# Patient Record
Sex: Male | Born: 1960 | Race: White | Hispanic: No | Smoking: Never smoker
Health system: Southern US, Community
[De-identification: ages and names within clinical notes are randomized; demographics above are authoritative.]

## PROBLEM LIST (undated history)

## (undated) DIAGNOSIS — M1990 Unspecified osteoarthritis, unspecified site: Secondary | ICD-10-CM

## (undated) HISTORY — PX: WISDOM TOOTH EXTRACTION: SHX21

## (undated) HISTORY — PX: JOINT REPLACEMENT: SHX530

---

## 2015-05-27 NOTE — H&P (Signed)
TOTAL HIP ADMISSION H&P  Patient is admitted for right total hip arthroplasty, anterior approach.  Subjective:  Chief Complaint:    Right hip primary OA / pain  HPI: Scott Stevenson, 54 y.o. male, has a history of pain and functional disability in the right hip(s) due to arthritis and patient has failed non-surgical conservative treatments for greater than 12 weeks to include NSAID's and/or analgesics, corticosteriod injections and activity modification.  Onset of symptoms was gradual starting 2+ years ago with gradually worsening course since that time.The patient noted no past surgery on the right hip(s).  Patient currently rates pain in the right hip at 5 out of 10 with activity. Patient has worsening of pain with activity and weight bearing, trendelenberg gait, pain that interfers with activities of daily living and pain with passive range of motion. Patient has evidence of periarticular osteophytes and joint space narrowing by imaging studies. This condition presents safety issues increasing the risk of falls.   There is no current active infection.  Risks, benefits and expectations were discussed with the patient.  Risks including but not limited to the risk of anesthesia, blood clots, nerve damage, blood vessel damage, failure of the prosthesis, infection and up to and including death.  Patient understand the risks, benefits and expectations and wishes to proceed with surgery.   PCP: Eartha Inch, MD  D/C Plans:      Home with HHPT  Post-op Meds:       No Rx given   Tranexamic Acid:      To be given - IV  Decadron:      Is to be given  FYI:     ASA post-op  Norco post-op    There are no active problems to display for this patient.  Past Medical History  Diagnosis Date  . Arthritis     Past Surgical History  Procedure Laterality Date  . Wisdom tooth extraction      No prescriptions prior to admission   No Known Allergies   History  Substance Use Topics  . Smoking  status: Never Smoker   . Smokeless tobacco: Not on file  . Alcohol Use: Yes     Comment: occasional       Review of Systems  Constitutional: Negative.   Eyes: Negative.   Respiratory: Negative.   Cardiovascular: Negative.   Gastrointestinal: Negative.   Genitourinary: Negative.   Musculoskeletal: Positive for joint pain.  Skin: Negative.   Neurological: Positive for headaches.  Endo/Heme/Allergies: Negative.   Psychiatric/Behavioral: Negative.     Objective:  Physical Exam  Constitutional: He is oriented to person, place, and time. He appears well-developed.  HENT:  Head: Normocephalic.  Eyes: Pupils are equal, round, and reactive to light.  Neck: Neck supple. No JVD present. No tracheal deviation present. No thyromegaly present.  Cardiovascular: Normal rate, regular rhythm, normal heart sounds and intact distal pulses.   Respiratory: Effort normal and breath sounds normal. No stridor. No respiratory distress. He has no wheezes.  GI: Soft. There is no tenderness. There is no guarding.  Musculoskeletal:       Right hip: He exhibits decreased range of motion, decreased strength, tenderness and bony tenderness. He exhibits no swelling, no deformity and no laceration.  Lymphadenopathy:    He has no cervical adenopathy.  Neurological: He is alert and oriented to person, place, and time.  Skin: Skin is warm and dry.  Psychiatric: He has a normal mood and affect.     Imaging Review  Plain radiographs demonstrate severe degenerative joint disease of the right hip(s). The bone quality appears to be good for age and reported activity level.  Assessment/Plan:  End stage arthritis, right hip(s)  The patient history, physical examination, clinical judgement of the provider and imaging studies are consistent with end stage degenerative joint disease of the right hip(s) and total hip arthroplasty is deemed medically necessary. The treatment options including medical management,  injection therapy, arthroscopy and arthroplasty were discussed at length. The risks and benefits of total hip arthroplasty were presented and reviewed. The risks due to aseptic loosening, infection, stiffness, dislocation/subluxation,  thromboembolic complications and other imponderables were discussed.  The patient acknowledged the explanation, agreed to proceed with the plan and consent was signed. Patient is being admitted for inpatient treatment for surgery, pain control, PT, OT, prophylactic antibiotics, VTE prophylaxis, progressive ambulation and ADL's and discharge planning.The patient is planning to be discharged home with home health services.    Anastasio Auerbach Gwendolynn Merkey   PA-C  06/03/2015, 2:14 PM

## 2015-05-31 NOTE — Patient Instructions (Addendum)
YOUR PROCEDURE IS SCHEDULED ON :  06/04/15  REPORT TO [facility] MAIN ENTRANCE FOLLOW SIGNS TO EAST ELEVATOR - GO TO 3rd FLOOR CHECK IN AT 3 EAST NURSES STATION (SHORT STAY) AT:  5:15 AM  CALL THIS NUMBER IF YOU HAVE PROBLEMS THE MORNING OF SURGERY [phone]  REMEMBER:ONLY 1 PER PERSON MAY GO TO SHORT STAY WITH YOU TO GET READY THE MORNING OF YOUR SURGERY  DO NOT EAT FOOD OR DRINK LIQUIDS AFTER MIDNIGHT  TAKE THESE MEDICINES THE MORNING OF SURGERY: NONE  YOU MAY NOT HAVE ANY METAL ON YOUR BODY INCLUDING HAIR PINS AND PIERCING'S. DO NOT WEAR JEWELRY, MAKEUP, LOTIONS, POWDERS OR PERFUMES. DO NOT WEAR NAIL POLISH. DO NOT SHAVE 48 HRS PRIOR TO SURGERY. MEN MAY SHAVE FACE AND NECK.  DO NOT BRING VALUABLES TO HOSPITAL. [facility] IS NOT RESPONSIBLE FOR VALUABLES.  CONTACTS, DENTURES OR PARTIALS MAY NOT BE WORN TO SURGERY. LEAVE SUITCASE IN CAR. CAN BE BROUGHT TO ROOM AFTER SURGERY.  PATIENTS DISCHARGED THE DAY OF SURGERY WILL NOT BE ALLOWED TO DRIVE HOME.  PLEASE READ OVER THE FOLLOWING INSTRUCTION SHEETS _________________________________________________________________________________                                           - PREPARING FOR SURGERY  Before surgery, you can play an important role.  Because skin is not sterile, your skin needs to be as free of germs as possible.  You can reduce the number of germs on your skin by washing with CHG (chlorahexidine gluconate) soap before surgery.  CHG is an antiseptic cleaner which kills germs and bonds with the skin to continue killing germs even after washing. Please DO NOT use if you have an allergy to CHG or antibacterial soaps.  If your skin becomes reddened/irritated stop using the CHG and inform your nurse when you arrive at Short Stay. Do not shave (including legs and underarms) for at least 48 hours prior to the first CHG shower.  You may shave your face. Please follow these instructions  carefully:   1.  Shower with CHG Soap the night before surgery and the  morning of Surgery.   2.  If you choose to wash your hair, wash your hair first as usual with your  normal  Shampoo.   3.  After you shampoo, rinse your hair and body thoroughly to remove the  shampoo.                                         4.  Use CHG as you would any other liquid soap.  You can apply chg directly  to the skin and wash . Gently wash with scrungie or clean wascloth    5.  Apply the CHG Soap to your body ONLY FROM THE NECK DOWN.   Do not use on open                           Wound or open sores. Avoid contact with eyes, ears mouth and genitals (private parts).                        Genitals (private parts) with your normal soap.  6.  Wash thoroughly, paying special attention to the area where your surgery  will be performed.   7.  Thoroughly rinse your body with warm water from the neck down.   8.  DO NOT shower/wash with your normal soap after using and rinsing off  the CHG Soap .                9.  Pat yourself dry with a clean towel.             10.  Wear clean night clothes to bed after shower             11.  Place clean sheets on your bed the night of your first shower and do not  sleep with pets.  Day of Surgery : Do not apply any lotions/deodorants the morning of surgery.  Please wear clean clothes to the hospital/surgery center.  FAILURE TO FOLLOW THESE INSTRUCTIONS MAY RESULT IN THE CANCELLATION OF YOUR SURGERY    PATIENT SIGNATURE_________________________________  ______________________________________________________________________     Scott Stevenson  An incentive spirometer is a tool that can help keep your lungs clear and active. This tool measures how well you are filling your lungs with each breath. Taking long deep breaths may help reverse or decrease the chance of developing breathing (pulmonary) problems (especially infection) following:  A long  period of time when you are unable to move or be active. BEFORE THE PROCEDURE   If the spirometer includes an indicator to show your best effort, your nurse or respiratory therapist will set it to a desired goal.  If possible, sit up straight or lean slightly forward. Try not to slouch.  Hold the incentive spirometer in an upright position. INSTRUCTIONS FOR USE   Sit on the edge of your bed if possible, or sit up as far as you can in bed or on a chair.  Hold the incentive spirometer in an upright position.  Breathe out normally.  Place the mouthpiece in your mouth and seal your lips tightly around it.  Breathe in slowly and as deeply as possible, raising the piston or the ball toward the top of the column.  Hold your breath for 3-5 seconds or for as long as possible. Allow the piston or ball to fall to the bottom of the column.  Remove the mouthpiece from your mouth and breathe out normally.  Rest for a few seconds and repeat Steps 1 through 7 at least 10 times every 1-2 hours when you are awake. Take your time and take a few normal breaths between deep breaths.  The spirometer may include an indicator to show your best effort. Use the indicator as a goal to work toward during each repetition.  After each set of 10 deep breaths, practice coughing to be sure your lungs are clear. If you have an incision (the cut made at the time of surgery), support your incision when coughing by placing a pillow or rolled up towels firmly against it. Once you are able to get out of bed, walk around indoors and cough well. You may stop using the incentive spirometer when instructed by your caregiver.  RISKS AND COMPLICATIONS  Take your time so you do not get dizzy or light-headed.  If you are in pain, you may need to take or ask for pain medication before doing incentive spirometry. It is harder to take a deep breath if you are having pain. AFTER USE  Rest and breathe slowly and easily.  It can  be helpful to keep track of a log of your progress. Your caregiver can provide you with a simple table to help with this. If you are using the spirometer at home, follow these instructions: [place] IF:   You are having difficultly using the spirometer.  You have trouble using the spirometer as often as instructed.  Your pain medication is not giving enough relief while using the spirometer.  You develop fever of 100.5 F (38.1 C) or higher. SEEK IMMEDIATE MEDICAL CARE IF:   You cough up bloody sputum that had not been present before.  You develop fever of 102 F (38.9 C) or greater.  You develop worsening pain at or near the incision site. MAKE SURE YOU:   Understand these instructions.  Will watch your condition.  Will get help right away if you are not doing well or get worse. Document Released: 02/22/2007 Document Revised: 01/04/2012 Document Reviewed: 04/25/2007 [employer] Patient Information 2014 [employer], [place].   ________________________________________________________________________  WHAT IS A BLOOD TRANSFUSION? Blood Transfusion Information  A transfusion is the replacement of blood or some of its parts. Blood is made up of multiple cells which provide different functions.  Red blood cells carry oxygen and are used for blood loss replacement.  White blood cells fight against infection.  Platelets control bleeding.  Plasma helps clot blood.  Other blood products are available for specialized needs, such as hemophilia or other clotting disorders. BEFORE THE TRANSFUSION  Who gives blood for transfusions?   Healthy volunteers who are fully evaluated to make sure their blood is safe. This is blood bank blood. Transfusion therapy is the safest it has ever been in the practice of medicine. Before blood is taken from a donor, a complete history is taken to make sure that person has no history of diseases nor engages in risky social behavior (examples are  intravenous drug use or sexual activity with multiple partners). The donor's travel history is screened to minimize risk of transmitting infections, such as malaria. The donated blood is tested for signs of infectious diseases, such as HIV and hepatitis. The blood is then tested to be sure it is compatible with you in order to minimize the chance of a transfusion reaction. If you or a relative donates blood, this is often done in anticipation of surgery and is not appropriate for emergency situations. It takes many days to process the donated blood. RISKS AND COMPLICATIONS Although transfusion therapy is very safe and saves many lives, the main dangers of transfusion include:   Getting an infectious disease.  Developing a transfusion reaction. This is an allergic reaction to something in the blood you were given. Every precaution is taken to prevent this. The decision to have a blood transfusion has been considered carefully by your caregiver before blood is given. Blood is not given unless the benefits outweigh the risks. AFTER THE TRANSFUSION  Right after receiving a blood transfusion, you will usually feel much better and more energetic. This is especially true if your red blood cells have gotten low (anemic). The transfusion raises the level of the red blood cells which carry oxygen, and this usually causes an energy increase.  The nurse administering the transfusion will monitor you carefully for complications. HOME CARE INSTRUCTIONS  No special instructions are needed after a transfusion. You may find your energy is better. Speak with your caregiver about any limitations on activity for underlying diseases you may have. SEEK MEDICAL CARE IF:   Your  condition is not improving after your transfusion.  You develop redness or irritation at the intravenous (IV) site. SEEK IMMEDIATE MEDICAL CARE IF:  Any of the following symptoms occur over the next 12 hours:  Shaking chills.  You have a  temperature by mouth above 102 F (38.9 C), not controlled by medicine.  Chest, back, or muscle pain.  People around you feel you are not acting correctly or are confused.  Shortness of breath or difficulty breathing.  Dizziness and fainting.  You get a rash or develop hives.  You have a decrease in urine output.  Your urine turns a dark color or changes to pink, red, or brown. Any of the following symptoms occur over the next 10 days:  You have a temperature by mouth above 102 F (38.9 C), not controlled by medicine.  Shortness of breath.  Weakness after normal activity.  The white part of the eye turns yellow (jaundice).  You have a decrease in the amount of urine or are urinating less often.  Your urine turns a dark color or changes to pink, red, or brown. Document Released: 10/09/2000 Document Revised: 01/04/2012 Document Reviewed: 05/28/2008 [facility] Patient Information 2014 [place], [place].  _______________________________________________________________________

## 2015-06-03 ENCOUNTER — Encounter (HOSPITAL_COMMUNITY)
Admission: RE | Admit: 2015-06-03 | Discharge: 2015-06-03 | Disposition: A | Discharge status: Home / Self Care | Payer: BC Managed Care – PPO | Source: Ambulatory Visit | Attending: Orthopedic Surgery | Admitting: Orthopedic Surgery

## 2015-06-03 ENCOUNTER — Encounter (HOSPITAL_COMMUNITY): Payer: Self-pay

## 2015-06-03 ENCOUNTER — Encounter (HOSPITAL_COMMUNITY): Payer: Self-pay | Admitting: Anesthesiology

## 2015-06-03 HISTORY — DX: Unspecified osteoarthritis, unspecified site: M19.90

## 2015-06-03 LAB — BASIC METABOLIC PANEL
ANION GAP: 6 (ref 5–15)
BUN: 16 mg/dL (ref 6–20)
CHLORIDE: 102 mmol/L (ref 101–111)
CO2: 27 mmol/L (ref 22–32)
Calcium: 9.1 mg/dL (ref 8.9–10.3)
Creatinine, Ser: 0.99 mg/dL (ref 0.61–1.24)
GLUCOSE: 105 mg/dL — AB (ref 65–99)
Potassium: 4.4 mmol/L (ref 3.5–5.1)
Sodium: 135 mmol/L (ref 135–145)

## 2015-06-03 LAB — URINALYSIS, ROUTINE W REFLEX MICROSCOPIC
Bilirubin Urine: NEGATIVE
Glucose, UA: NEGATIVE mg/dL
Hgb urine dipstick: NEGATIVE
KETONES UR: NEGATIVE mg/dL
Leukocytes, UA: NEGATIVE
NITRITE: NEGATIVE
PH: 5.5 (ref 5.0–8.0)
PROTEIN: NEGATIVE mg/dL
Specific Gravity, Urine: 1.013 (ref 1.005–1.030)
Urobilinogen, UA: 0.2 mg/dL (ref 0.0–1.0)

## 2015-06-03 LAB — CBC
HEMATOCRIT: 43.8 % (ref 39.0–52.0)
HEMOGLOBIN: 14.3 g/dL (ref 13.0–17.0)
MCH: 32.4 pg (ref 26.0–34.0)
MCHC: 32.6 g/dL (ref 30.0–36.0)
MCV: 99.3 fL (ref 78.0–100.0)
PLATELETS: 239 10*3/uL (ref 150–400)
RBC: 4.41 MIL/uL (ref 4.22–5.81)
RDW: 12.7 % (ref 11.5–15.5)
WBC: 5.4 10*3/uL (ref 4.0–10.5)

## 2015-06-03 LAB — ABO/RH: ABO/RH(D): A POS

## 2015-06-03 LAB — PROTIME-INR
INR: 0.92 (ref 0.00–1.49)
PROTHROMBIN TIME: 12.6 s (ref 11.6–15.2)

## 2015-06-03 LAB — APTT: aPTT: 28 seconds (ref 24–37)

## 2015-06-03 NOTE — Progress Notes (Signed)
PT is currently using nasal Mupuricin ointment per Dr. Nilsa Nutting order - no PCR done at PST appt.

## 2015-06-03 NOTE — Anesthesia Preprocedure Evaluation (Addendum)
Anesthesia Evaluation  Patient identified by MRN, date of birth, ID band Patient awake    Reviewed: Allergy & Precautions, NPO status , Patient's Chart, lab work & pertinent test results  Airway Mallampati: II  TM Distance: >3 FB Neck ROM: Full    Dental no notable dental hx.    Pulmonary neg pulmonary ROS,  breath sounds clear to auscultation  Pulmonary exam normal       Cardiovascular negative cardio ROS Normal cardiovascular examRhythm:Regular Rate:Normal     Neuro/Psych negative neurological ROS  negative psych ROS   GI/Hepatic negative GI ROS, Neg liver ROS,   Endo/Other  negative endocrine ROS  Renal/GU negative Renal ROS  negative genitourinary   Musculoskeletal  (+) Arthritis -,   Abdominal   Peds negative pediatric ROS (+)  Hematology negative hematology ROS (+)   Anesthesia Other Findings   Reproductive/Obstetrics negative OB ROS                            Anesthesia Physical Anesthesia Plan  ASA: II  Anesthesia Plan: Spinal   Post-op Pain Management:    Induction: Intravenous  Airway Management Planned: Natural Airway  Additional Equipment:   Intra-op Plan:   Post-operative Plan:   Informed Consent: I have reviewed the patients History and Physical, chart, labs and discussed the procedure including the risks, benefits and alternatives for the proposed anesthesia with the patient or authorized representative who has indicated his/her understanding and acceptance.   Dental advisory given  Plan Discussed with: CRNA  Anesthesia Plan Comments: (Discussed risks and benefits of and differences between spinal and general. Discussed risks of spinal including headache, backache, failure, bleeding and hematoma, infection, and nerve damage and paralysis. Patient consents to spinal. Questions answered. Coagulation studies and platelet count acceptable. )       Anesthesia  Quick Evaluation

## 2015-06-04 ENCOUNTER — Inpatient Hospital Stay (HOSPITAL_COMMUNITY): Payer: BC Managed Care – PPO

## 2015-06-04 ENCOUNTER — Inpatient Hospital Stay (HOSPITAL_COMMUNITY)
Admission: AD | Admit: 2015-06-04 | Discharge: 2015-06-05 | DRG: 470 | Disposition: A | Discharge status: Home Health | Payer: BC Managed Care – PPO | Source: Ambulatory Visit | Attending: Orthopedic Surgery | Admitting: Orthopedic Surgery

## 2015-06-04 ENCOUNTER — Inpatient Hospital Stay (HOSPITAL_COMMUNITY): Payer: BC Managed Care – PPO | Admitting: Anesthesiology

## 2015-06-04 ENCOUNTER — Encounter (HOSPITAL_COMMUNITY)
Admission: AD | Disposition: A | Discharge status: Home Health | Payer: Self-pay | Source: Ambulatory Visit | Attending: Orthopedic Surgery

## 2015-06-04 ENCOUNTER — Encounter (HOSPITAL_COMMUNITY): Payer: Self-pay | Admitting: *Deleted

## 2015-06-04 DIAGNOSIS — M1611 Unilateral primary osteoarthritis, right hip: Secondary | ICD-10-CM | POA: Diagnosis present

## 2015-06-04 DIAGNOSIS — Z01812 Encounter for preprocedural laboratory examination: Secondary | ICD-10-CM | POA: Diagnosis not present

## 2015-06-04 DIAGNOSIS — Z96649 Presence of unspecified artificial hip joint: Secondary | ICD-10-CM

## 2015-06-04 DIAGNOSIS — M25551 Pain in right hip: Secondary | ICD-10-CM | POA: Diagnosis present

## 2015-06-04 HISTORY — PX: TOTAL HIP ARTHROPLASTY: SHX124

## 2015-06-04 LAB — TYPE AND SCREEN
ABO/RH(D): A POS
ANTIBODY SCREEN: NEGATIVE

## 2015-06-04 SURGERY — ARTHROPLASTY, HIP, TOTAL, ANTERIOR APPROACH
Anesthesia: Spinal | Site: Hip | Laterality: Right

## 2015-06-04 MED ORDER — CEFAZOLIN SODIUM-DEXTROSE 2-3 GM-% IV SOLR
INTRAVENOUS | Status: AC
  Filled 2015-06-04: qty 50

## 2015-06-04 MED ORDER — ALUM & MAG HYDROXIDE-SIMETH 200-200-20 MG/5ML PO SUSP: 30.0000 mL | ORAL | Status: DC | PRN

## 2015-06-04 MED ORDER — ASPIRIN EC 325 MG PO TBEC
325.0000 mg | DELAYED_RELEASE_TABLET | Freq: Two times a day (BID) | ORAL | Status: DC
  Administered 2015-06-05: 325 mg via ORAL
  Filled 2015-06-04 (×3): qty 1

## 2015-06-04 MED ORDER — DOCUSATE SODIUM 100 MG PO CAPS
100.0000 mg | ORAL_CAPSULE | Freq: Two times a day (BID) | ORAL | Status: DC
  Administered 2015-06-04 – 2015-06-05 (×3): 100 mg via ORAL

## 2015-06-04 MED ORDER — SODIUM CHLORIDE 0.9 % IV SOLN
100.0000 mL/h | INTRAVENOUS | Status: DC
  Administered 2015-06-05: 100 mL/h via INTRAVENOUS
  Filled 2015-06-04 (×9): qty 1000

## 2015-06-04 MED ORDER — MENTHOL 3 MG MT LOZG: 1.0000 | LOZENGE | OROMUCOSAL | Status: DC | PRN

## 2015-06-04 MED ORDER — BISACODYL 10 MG RE SUPP: 10.0000 mg | Freq: Every day | RECTAL | Status: DC | PRN

## 2015-06-04 MED ORDER — METOCLOPRAMIDE HCL 5 MG PO TABS
5.0000 mg | ORAL_TABLET | Freq: Three times a day (TID) | ORAL | Status: DC | PRN
  Filled 2015-06-04: qty 2

## 2015-06-04 MED ORDER — ASPIRIN EC 325 MG PO TBEC: 325.0000 mg | DELAYED_RELEASE_TABLET | Freq: Two times a day (BID) | ORAL | Status: AC

## 2015-06-04 MED ORDER — MAGNESIUM CITRATE PO SOLN: 1.0000 | Freq: Once | ORAL | Status: DC | PRN

## 2015-06-04 MED ORDER — METHOCARBAMOL 1000 MG/10ML IJ SOLN
500.0000 mg | Freq: Four times a day (QID) | INTRAVENOUS | Status: DC | PRN
  Administered 2015-06-04: 500 mg via INTRAVENOUS
  Filled 2015-06-04 (×2): qty 5

## 2015-06-04 MED ORDER — HYDROMORPHONE HCL 1 MG/ML IJ SOLN
INTRAMUSCULAR | Status: AC
  Filled 2015-06-04: qty 1

## 2015-06-04 MED ORDER — FERROUS SULFATE 325 (65 FE) MG PO TABS
325.0000 mg | ORAL_TABLET | Freq: Three times a day (TID) | ORAL | Status: DC
Start: 2015-06-04 — End: 2015-06-05
  Administered 2015-06-05 (×2): 325 mg via ORAL
  Filled 2015-06-04 (×6): qty 1

## 2015-06-04 MED ORDER — MIDAZOLAM HCL 2 MG/2ML IJ SOLN
INTRAMUSCULAR | Status: AC
  Filled 2015-06-04: qty 4

## 2015-06-04 MED ORDER — PROPOFOL INFUSION 10 MG/ML OPTIME
INTRAVENOUS | Status: DC | PRN
  Administered 2015-06-04: 120 ug/kg/min via INTRAVENOUS

## 2015-06-04 MED ORDER — LACTATED RINGERS IV SOLN
INTRAVENOUS | Status: DC
  Administered 2015-06-04: 10:00:00 via INTRAVENOUS

## 2015-06-04 MED ORDER — METOCLOPRAMIDE HCL 5 MG/ML IJ SOLN: 5.0000 mg | Freq: Three times a day (TID) | INTRAMUSCULAR | Status: DC | PRN

## 2015-06-04 MED ORDER — HYDROCODONE-ACETAMINOPHEN 7.5-325 MG PO TABS
1.0000 | ORAL_TABLET | ORAL | Status: DC
  Administered 2015-06-04 – 2015-06-05 (×7): 1 via ORAL
  Filled 2015-06-04: qty 1
  Filled 2015-06-04: qty 2
  Filled 2015-06-04: qty 1
  Filled 2015-06-04: qty 2
  Filled 2015-06-04: qty 1
  Filled 2015-06-04: qty 2
  Filled 2015-06-04: qty 1

## 2015-06-04 MED ORDER — MIDAZOLAM HCL 5 MG/5ML IJ SOLN
INTRAMUSCULAR | Status: DC | PRN
  Administered 2015-06-04: 1 mg via INTRAVENOUS
  Administered 2015-06-04: 2 mg via INTRAVENOUS

## 2015-06-04 MED ORDER — POLYETHYLENE GLYCOL 3350 17 G PO PACK
17.0000 g | PACK | Freq: Two times a day (BID) | ORAL | Status: DC
  Administered 2015-06-04 – 2015-06-05 (×2): 17 g via ORAL

## 2015-06-04 MED ORDER — CEFAZOLIN SODIUM-DEXTROSE 2-3 GM-% IV SOLR
2.0000 g | INTRAVENOUS | Status: AC
  Administered 2015-06-04: 2 g via INTRAVENOUS

## 2015-06-04 MED ORDER — ONDANSETRON HCL 4 MG PO TABS: 4.0000 mg | ORAL_TABLET | Freq: Four times a day (QID) | ORAL | Status: DC | PRN

## 2015-06-04 MED ORDER — METHOCARBAMOL 500 MG PO TABS
500.0000 mg | ORAL_TABLET | Freq: Four times a day (QID) | ORAL | Status: DC | PRN
  Administered 2015-06-05 (×2): 500 mg via ORAL
  Filled 2015-06-04 (×2): qty 1

## 2015-06-04 MED ORDER — HYDROMORPHONE HCL 1 MG/ML IJ SOLN
0.5000 mg | INTRAMUSCULAR | Status: DC | PRN
  Administered 2015-06-04: 0.5 mg via INTRAVENOUS
  Filled 2015-06-04: qty 1

## 2015-06-04 MED ORDER — DEXAMETHASONE SODIUM PHOSPHATE 10 MG/ML IJ SOLN
INTRAMUSCULAR | Status: AC
  Filled 2015-06-04: qty 1

## 2015-06-04 MED ORDER — LACTATED RINGERS IV SOLN
INTRAVENOUS | Status: DC | PRN
  Administered 2015-06-04 (×3): via INTRAVENOUS

## 2015-06-04 MED ORDER — HYDROCODONE-ACETAMINOPHEN 7.5-325 MG PO TABS: 1.0000 | ORAL_TABLET | ORAL | Status: DC | PRN

## 2015-06-04 MED ORDER — TRANEXAMIC ACID 1000 MG/10ML IV SOLN
1000.0000 mg | Freq: Once | INTRAVENOUS | Status: AC
  Administered 2015-06-04: 1000 mg via INTRAVENOUS
  Filled 2015-06-04: qty 10

## 2015-06-04 MED ORDER — PHENOL 1.4 % MT LIQD: 1.0000 | OROMUCOSAL | Status: DC | PRN

## 2015-06-04 MED ORDER — ONDANSETRON HCL 4 MG/2ML IJ SOLN: 4.0000 mg | Freq: Four times a day (QID) | INTRAMUSCULAR | Status: DC | PRN

## 2015-06-04 MED ORDER — CELECOXIB 200 MG PO CAPS
200.0000 mg | ORAL_CAPSULE | Freq: Two times a day (BID) | ORAL | Status: DC
  Administered 2015-06-04 – 2015-06-05 (×3): 200 mg via ORAL
  Filled 2015-06-04 (×5): qty 1

## 2015-06-04 MED ORDER — PROPOFOL 10 MG/ML IV BOLUS
INTRAVENOUS | Status: AC
  Filled 2015-06-04: qty 20

## 2015-06-04 MED ORDER — PROMETHAZINE HCL 25 MG/ML IJ SOLN: 6.2500 mg | INTRAMUSCULAR | Status: DC | PRN

## 2015-06-04 MED ORDER — ONDANSETRON HCL 4 MG/2ML IJ SOLN
INTRAMUSCULAR | Status: AC
  Filled 2015-06-04: qty 2

## 2015-06-04 MED ORDER — DIPHENHYDRAMINE HCL 25 MG PO CAPS: 25.0000 mg | ORAL_CAPSULE | Freq: Four times a day (QID) | ORAL | Status: DC | PRN

## 2015-06-04 MED ORDER — DEXAMETHASONE SODIUM PHOSPHATE 10 MG/ML IJ SOLN
10.0000 mg | Freq: Once | INTRAMUSCULAR | Status: AC
  Administered 2015-06-05: 10 mg via INTRAVENOUS
  Filled 2015-06-04: qty 1

## 2015-06-04 MED ORDER — FENTANYL CITRATE (PF) 100 MCG/2ML IJ SOLN
INTRAMUSCULAR | Status: DC | PRN
  Administered 2015-06-04: 100 ug via INTRAVENOUS

## 2015-06-04 MED ORDER — FENTANYL CITRATE (PF) 100 MCG/2ML IJ SOLN
INTRAMUSCULAR | Status: AC
  Filled 2015-06-04: qty 4

## 2015-06-04 MED ORDER — ONDANSETRON HCL 4 MG/2ML IJ SOLN
INTRAMUSCULAR | Status: DC | PRN
  Administered 2015-06-04: 4 mg via INTRAVENOUS

## 2015-06-04 MED ORDER — CEFAZOLIN SODIUM-DEXTROSE 2-3 GM-% IV SOLR
2.0000 g | Freq: Four times a day (QID) | INTRAVENOUS | Status: AC
  Administered 2015-06-04 (×2): 2 g via INTRAVENOUS
  Filled 2015-06-04 (×2): qty 50

## 2015-06-04 MED ORDER — METHOCARBAMOL 500 MG PO TABS: 500.0000 mg | ORAL_TABLET | Freq: Four times a day (QID) | ORAL | Status: DC | PRN

## 2015-06-04 MED ORDER — PROPOFOL 10 MG/ML IV BOLUS
INTRAVENOUS | Status: DC | PRN
  Administered 2015-06-04: 50 mg via INTRAVENOUS
  Administered 2015-06-04: 30 mg via INTRAVENOUS

## 2015-06-04 MED ORDER — DEXAMETHASONE SODIUM PHOSPHATE 10 MG/ML IJ SOLN
10.0000 mg | Freq: Once | INTRAMUSCULAR | Status: AC
  Administered 2015-06-04: 10 mg via INTRAVENOUS

## 2015-06-04 MED ORDER — 0.9 % SODIUM CHLORIDE (POUR BTL) OPTIME
TOPICAL | Status: DC | PRN
  Administered 2015-06-04: 1000 mL

## 2015-06-04 MED ORDER — BUPIVACAINE IN DEXTROSE 0.75-8.25 % IT SOLN
INTRATHECAL | Status: DC | PRN
  Administered 2015-06-04: 2 mL via INTRATHECAL

## 2015-06-04 MED ORDER — HYDROMORPHONE HCL 1 MG/ML IJ SOLN
0.2500 mg | INTRAMUSCULAR | Status: DC | PRN
  Administered 2015-06-04: 0.5 mg via INTRAVENOUS

## 2015-06-04 MED ORDER — CHLORHEXIDINE GLUCONATE 4 % EX LIQD: 60.0000 mL | Freq: Once | CUTANEOUS | Status: DC

## 2015-06-04 SURGICAL SUPPLY — 40 items
BAG ZIPLOCK 12X15 (MISCELLANEOUS) IMPLANT
CAPT HIP TOTAL 2 ×3 IMPLANT
COVER PERINEAL POST (MISCELLANEOUS) ×3 IMPLANT
DRAPE C-ARM 42X120 X-RAY (DRAPES) ×3 IMPLANT
DRAPE STERI IOBAN 125X83 (DRAPES) ×3 IMPLANT
DRAPE U-SHAPE 47X51 STRL (DRAPES) ×9 IMPLANT
DRSG AQUACEL AG ADV 3.5X10 (GAUZE/BANDAGES/DRESSINGS) ×3 IMPLANT
DURAPREP 26ML APPLICATOR (WOUND CARE) ×3 IMPLANT
ELECT BLADE TIP CTD 4 INCH (ELECTRODE) ×3 IMPLANT
ELECT PENCIL ROCKER SW 15FT (MISCELLANEOUS) ×3 IMPLANT
ELECT REM PT RETURN 15FT ADLT (MISCELLANEOUS) ×3 IMPLANT
ELECT REM PT RETURN 9FT ADLT (ELECTROSURGICAL) ×3
ELECTRODE REM PT RTRN 9FT ADLT (ELECTROSURGICAL) ×1 IMPLANT
FACESHIELD WRAPAROUND (MASK) ×12 IMPLANT
GLOVE BIOGEL PI IND STRL 7.5 (GLOVE) ×3 IMPLANT
GLOVE BIOGEL PI IND STRL 8.5 (GLOVE) ×1 IMPLANT
GLOVE BIOGEL PI INDICATOR 7.5 (GLOVE) ×6
GLOVE BIOGEL PI INDICATOR 8.5 (GLOVE) ×2
GLOVE ECLIPSE 8.0 STRL XLNG CF (GLOVE) ×6 IMPLANT
GLOVE ORTHO TXT STRL SZ7.5 (GLOVE) ×3 IMPLANT
GLOVE SURG SS PI 7.0 STRL IVOR (GLOVE) ×3 IMPLANT
GLOVE SURG SS PI 7.5 STRL IVOR (GLOVE) ×3 IMPLANT
GOWN SPEC L3 XXLG W/TWL (GOWN DISPOSABLE) ×6 IMPLANT
GOWN STRL REUS W/TWL LRG LVL3 (GOWN DISPOSABLE) ×6 IMPLANT
HOLDER FOLEY CATH W/STRAP (MISCELLANEOUS) ×3 IMPLANT
KIT BASIN OR (CUSTOM PROCEDURE TRAY) ×3 IMPLANT
LIQUID BAND (GAUZE/BANDAGES/DRESSINGS) ×3 IMPLANT
PACK TOTAL JOINT (CUSTOM PROCEDURE TRAY) ×3 IMPLANT
PEN SKIN MARKING BROAD (MISCELLANEOUS) ×3 IMPLANT
SAW OSC TIP CART 19.5X105X1.3 (SAW) ×3 IMPLANT
SUT MNCRL AB 4-0 PS2 18 (SUTURE) ×3 IMPLANT
SUT VIC AB 1 CT1 36 (SUTURE) ×9 IMPLANT
SUT VIC AB 2-0 CT1 27 (SUTURE) ×4
SUT VIC AB 2-0 CT1 TAPERPNT 27 (SUTURE) ×2 IMPLANT
SUT VLOC 180 0 24IN GS25 (SUTURE) ×3 IMPLANT
TOWEL OR 17X26 10 PK STRL BLUE (TOWEL DISPOSABLE) ×3 IMPLANT
TOWEL OR NON WOVEN STRL DISP B (DISPOSABLE) ×3 IMPLANT
TRAY FOLEY W/METER SILVER 16FR (SET/KITS/TRAYS/PACK) ×3 IMPLANT
WATER STERILE IRR 1500ML POUR (IV SOLUTION) ×3 IMPLANT
YANKAUER SUCT BULB TIP 10FT TU (MISCELLANEOUS) ×3 IMPLANT

## 2015-06-04 NOTE — Anesthesia Postprocedure Evaluation (Signed)
  Anesthesia Post-op Note  Patient: Scott Stevenson  Procedure(s) Performed: Procedure(s) (LRB): RIGHT TOTAL HIP ARTHROPLASTY ANTERIOR APPROACH (Right)  Patient Location: PACU  Anesthesia Type: Spinal  Level of Consciousness: awake and alert   Airway and Oxygen Therapy: Patient Spontanous Breathing  Post-op Pain: mild  Post-op Assessment: Post-op Vital signs reviewed, Patient's Cardiovascular Status Stable, Respiratory Function Stable, Patent Airway and No signs of Nausea or vomiting. Spinal is receding.  Last Vitals:  Filed Vitals:   06/04/15 1300  BP: 122/72  Pulse: 56  Temp: 36.8 C  Resp: 16    Post-op Vital Signs: stable   Complications: No apparent anesthesia complications

## 2015-06-04 NOTE — Evaluation (Signed)
Physical Therapy Evaluation Patient Details Name: Scott Stevenson MRN: [id number] DOB: [birth year] Today's Date: 06/04/2015   History of Present Illness  Pt is a 54 year old male s/p R THA direct anterior approach  Clinical Impression  Pt is s/p R THA resulting in the deficits listed below (see PT Problem List).  Pt will benefit from skilled PT to increase their independence and safety with mobility to allow discharge to the venue listed below.  Pt mobilizing well POD #0 until syncope episode however assisted to recliner and immediately feeling better.  If pt able to transport, would benefit from outpatient PT, otherwise HHPT.     Follow Up Recommendations Outpatient PT    Equipment Recommendations  Rolling walker with 5" wheels    Recommendations for Other Services       Precautions / Restrictions Precautions Precautions: None Restrictions Other Position/Activity Restrictions: WBAT      Mobility  Bed Mobility Overal bed mobility: Needs Assistance Bed Mobility: Supine to Sit     Supine to sit: Supervision;HOB elevated     General bed mobility comments: verbal cues for technique, pt able to self assist R LE over EOB  Transfers Overall transfer level: Needs assistance   Transfers: Sit to/from Stand Sit to Stand: Min assist         General transfer comment: min/guard to stand, verbal cues for technique, more assist with return to chair due to syncope episode  Ambulation/Gait Ambulation/Gait assistance: Mod assist;Min guard Ambulation Distance (Feet): 30 Feet Assistive device: Rolling walker (2 wheeled) Gait Pattern/deviations: Step-to pattern;Antalgic     General Gait Details: verbal cues for sequence, RW distance, posture, pt with abrupt syncope episode thus requiring more assist, tech grabbed recliner and pt assisted to sitting, BP 112/66 mmHg HR 57 (RN in hallway and aware)  Stairs            Wheelchair Mobility    Modified Rankin (Stroke Patients  Only)       Balance                                             Pertinent Vitals/Pain Pain Assessment: No/denies pain    Home Living Family/patient expects to be discharged to:: Private residence Living Arrangements: Spouse/significant other Available Help at Discharge: Family Type of Home: House Home Access: Stairs to enter   Secretary/administrator of Steps: 2-3 Home Layout: Able to live on main level with bedroom/bathroom Home Equipment: None      Prior Function Level of Independence: Independent               Hand Dominance        Extremity/Trunk Assessment               Lower Extremity Assessment: RLE deficits/detail RLE Deficits / Details: functional hip weakness observed, able to perform ankle pump       Communication   Communication: No difficulties  Cognition Arousal/Alertness: Awake/alert Behavior During Therapy: WFL for tasks assessed/performed Overall Cognitive Status: Within Functional Limits for tasks assessed                      General Comments      Exercises        Assessment/Plan    PT Assessment Patient needs continued PT services  PT Diagnosis Abnormality of gait   PT Problem List Decreased  range of motion;Decreased strength;Decreased mobility;Decreased knowledge of use of DME;Pain  PT Treatment Interventions Functional mobility training;Gait training;DME instruction;Stair training;Patient/family education;Therapeutic activities;Therapeutic exercise   PT Goals (Current goals can be found in the Care Plan section) Acute Rehab PT Goals PT Goal Formulation: With patient Time For Goal Achievement: 06/08/15 Potential to Achieve Goals: Good    Frequency 7X/week   Barriers to discharge        Co-evaluation               End of Session Equipment Utilized During Treatment: Gait belt Activity Tolerance: Other (comment) (limited by syncope episode) Patient left: in chair;with call  bell/phone within reach;with nursing/sitter in room (with RN)           Time: 1425-1440 PT Time Calculation (min) (ACUTE ONLY): 15 min   Charges:   PT Evaluation $Initial PT Evaluation Tier I: 1 Procedure     PT G Codes:        Micheal Sheen,KATHrine E 06/04/2015, 2:52 PM Zenovia Jarred, PT, DPT 06/04/2015 Pager: [phone]

## 2015-06-04 NOTE — Progress Notes (Signed)
Utilization review completed.  

## 2015-06-04 NOTE — Discharge Instructions (Signed)

## 2015-06-04 NOTE — Op Note (Signed)
NAME:  Scott Stevenson                ACCOUNT NO.: [id number]      MEDICAL RECORD NO.: [id number]      FACILITY:  [facility]      PHYSICIAN:  Durene Romans D  DATE OF BIRTH:  1960/11/17     DATE OF PROCEDURE:  06/04/2015                                 OPERATIVE REPORT         PREOPERATIVE DIAGNOSIS: Right  hip osteoarthritis.      POSTOPERATIVE DIAGNOSIS:  Right hip osteoarthritis.      PROCEDURE:  Right total hip replacement through an anterior approach   utilizing DePuy THR system, component size 54mm pinnacle cup, a size 36+4 neutral   Altrex liner, a size 7Hi Tri Lock stem with a 36+8.5 delta ceramic   ball.      SURGEON:  Madlyn Frankel. Charlann Boxer, M.D.      ASSISTANT:  Lanney Gins, PA-C     ANESTHESIA:  Spinal.      SPECIMENS:  None.      COMPLICATIONS:  None.      BLOOD LOSS:  600 cc     DRAINS:  None.      INDICATION OF THE PROCEDURE:  [name] is a 54 y.o. male who had   presented to office for evaluation of right hip pain.  Radiographs revealed   progressive degenerative changes with bone-on-bone   articulation to the  hip joint.  The patient had painful limited range of   motion significantly affecting their overall quality of life.  The patient was failing to    respond to conservative measures, and at this point was ready   to proceed with more definitive measures.  The patient has noted progressive   degenerative changes in his hip, progressive problems and dysfunction   with regarding the hip prior to surgery.  Consent was obtained for   benefit of pain relief.  Specific risk of infection, DVT, component   failure, dislocation, need for revision surgery, as well discussion of   the anterior versus posterior approach were reviewed.  Consent was   obtained for benefit of anterior pain relief through an anterior   approach.      PROCEDURE IN DETAIL:  The patient was brought to operative theater.   Once adequate anesthesia, preoperative  antibiotics, 2 gm of Ancef, 1 gm of Tranexamic Acid, and 10 mg of Decadron administered.   The patient was positioned supine on the OSI Hanna table.  Once adequate   padding of boney process was carried out, we had predraped out the hip, and  used fluoroscopy to confirm orientation of the pelvis and position.      The right hip was then prepped and draped from proximal iliac crest to   mid thigh with shower curtain technique.      Time-out was performed identifying the patient, planned procedure, and   extremity.     An incision was then made 2 cm distal and lateral to the   anterior superior iliac spine extending over the orientation of the   tensor fascia lata muscle and sharp dissection was carried down to the   fascia of the muscle and protractor placed in the soft tissues.      The fascia was then incised.  The muscle belly was identified and swept   laterally and retractor placed along the superior neck.  Following   cauterization of the circumflex vessels and removing some pericapsular   fat, a second cobra retractor was placed on the inferior neck.  A third   retractor was placed on the anterior acetabulum after elevating the   anterior rectus.  A L-capsulotomy was along the line of the   superior neck to the trochanteric fossa, then extended proximally and   distally.  Tag sutures were placed and the retractors were then placed   intracapsular.  We then identified the trochanteric fossa and   orientation of my neck cut, confirmed this radiographically   and then made a neck osteotomy with the femur on traction.  The femoral   head was removed without difficulty or complication.  Traction was let   off and retractors were placed posterior and anterior around the   acetabulum.      The labrum and foveal tissue were debrided.  I began reaming with a 47mm   reamer and reamed up to 53mm reamer with good bony bed preparation and a 54mm   cup was chosen.  The final 54mm Pinnacle  cup was then impacted under fluoroscopy  to confirm the depth of penetration and orientation with respect to   abduction.  A screw was placed followed by the hole eliminator.  The final   36+4 neutral Altrex liner was impacted with good visualized rim fit.  The cup was positioned anatomically within the acetabular portion of the pelvis.      At this point, the femur was rolled at 80 degrees.  Further capsule was   released off the inferior aspect of the femoral neck.  I then   released the superior capsule proximally.  The hook was placed laterally   along the femur and elevated manually and held in position with the bed   hook.  The leg was then extended and adducted with the leg rolled to 100   degrees of external rotation.  Once the proximal femur was fully   exposed, I used a box osteotome to set orientation.  I then began   broaching with the starting chili pepper broach and passed this by hand and then broached up to 7.  With the 7 broach in place I chose a 7 neck and did several trial reductions.  The offset was appropriate, leg lengths   appeared to be equal best with the +8.5 head ball confirmed radiographically.   Given these findings, I went ahead and dislocated the hip, repositioned all   retractors and positioned the right hip in the extended and abducted position.  The final 7 Hi Tri Lock stem was   chosen and it was impacted down to the level of neck cut.  Based on this   and the trial reduction, a 36+8.5 delta ceramic ball was chosen and   impacted onto a clean and dry trunnion, and the hip was reduced.  The   hip had been irrigated throughout the case again at this point.  I did   reapproximate the superior capsular leaflet to the anterior leaflet   using #1 Vicryl.  The fascia of the   tensor fascia lata muscle was then reapproximated using #1 Vicryl and #0 V-lock sutures.  The   remaining wound was closed with 2-0 Vicryl and running 4-0 Monocryl.   The hip was cleaned,  dried, and dressed sterilely using Dermabond and  Aquacel dressing.  He was then brought   to recovery room in stable condition tolerating the procedure well.    Lanney Gins, PA-C was present for the entirety of the case involved from   preoperative positioning, perioperative retractor management, general   facilitation of the case, as well as primary wound closure as assistant.            Madlyn Frankel Charlann Boxer, M.D.        06/04/2015 8:48 AM

## 2015-06-04 NOTE — Anesthesia Procedure Notes (Signed)
Spinal Patient location during procedure: OR End time: 06/04/2015 7:20 AM Staffing Resident/CRNA: Enrigue Catena E Performed by: anesthesiologist  Preanesthetic Checklist Completed: patient identified, site marked, surgical consent, pre-op evaluation, timeout performed, IV checked, risks and benefits discussed and monitors and equipment checked Spinal Block Patient position: sitting Prep: Betadine Patient monitoring: heart rate, continuous pulse ox and blood pressure Location: L2-3 Injection technique: single-shot Needle Needle type: Spinocan and Sprotte  Needle gauge: 24 G Needle length: 9 cm Assessment Sensory level: T6 Additional Notes Expiration date of kit checked and confirmed. Patient tolerated procedure well, without complications.CSF x 3. Negative heme or paresthesia

## 2015-06-04 NOTE — Interval H&P Note (Signed)
History and Physical Interval Note:  06/04/2015 6:51 AM  Scott Stevenson  has presented today for surgery, with the diagnosis of RIGHT HIP OA  The various methods of treatment have been discussed with the patient and family. After consideration of risks, benefits and other options for treatment, the patient has consented to  Procedure(s): RIGHT TOTAL HIP ARTHROPLASTY ANTERIOR APPROACH (Right) as a surgical intervention .  The patient's history has been reviewed, patient examined, no change in status, stable for surgery.  I have reviewed the patient's chart and labs.  Questions were answered to the patient's satisfaction.     Shelda Pal

## 2015-06-04 NOTE — Transfer of Care (Signed)
Immediate Anesthesia Transfer of Care Note  Patient: Scott Stevenson  Procedure(s) Performed: Procedure(s): RIGHT TOTAL HIP ARTHROPLASTY ANTERIOR APPROACH (Right)  Patient Location: PACU  Anesthesia Type:Spinal  Level of Consciousness: awake, alert , oriented and patient cooperative  Airway & Oxygen Therapy: Patient Spontanous Breathing and Patient connected to face mask oxygen  Post-op Assessment: Report given to RN and Post -op Vital signs reviewed and stable  Post vital signs: stable  Last Vitals:  Filed Vitals:   06/04/15 0915  BP:   Pulse: 60  Temp: 36.4 C  Resp: 13    Complications: No apparent anesthesia complications  Spinal  Level L3 spinal level

## 2015-06-05 LAB — CBC
HCT: 33.1 % — ABNORMAL LOW (ref 39.0–52.0)
HEMOGLOBIN: 10.7 g/dL — AB (ref 13.0–17.0)
MCH: 31.8 pg (ref 26.0–34.0)
MCHC: 32.3 g/dL (ref 30.0–36.0)
MCV: 98.2 fL (ref 78.0–100.0)
Platelets: 160 10*3/uL (ref 150–400)
RBC: 3.37 MIL/uL — ABNORMAL LOW (ref 4.22–5.81)
RDW: 12.8 % (ref 11.5–15.5)
WBC: 8.7 10*3/uL (ref 4.0–10.5)

## 2015-06-05 LAB — BASIC METABOLIC PANEL
Anion gap: 6 (ref 5–15)
BUN: 12 mg/dL (ref 6–20)
CO2: 25 mmol/L (ref 22–32)
Calcium: 8.4 mg/dL — ABNORMAL LOW (ref 8.9–10.3)
Chloride: 105 mmol/L (ref 101–111)
Creatinine, Ser: 0.84 mg/dL (ref 0.61–1.24)
GFR calc Af Amer: >60 mL/min (ref >60)
GFR calc non Af Amer: >60 mL/min (ref >60)
GLUCOSE: 107 mg/dL — AB (ref 65–99)
Potassium: 4.3 mmol/L (ref 3.5–5.1)
Sodium: 136 mmol/L (ref 135–145)

## 2015-06-05 MED ORDER — DOCUSATE SODIUM 100 MG PO CAPS: 100.0000 mg | ORAL_CAPSULE | Freq: Two times a day (BID) | ORAL | Status: DC

## 2015-06-05 MED ORDER — FERROUS SULFATE 325 (65 FE) MG PO TABS: 325.0000 mg | ORAL_TABLET | Freq: Three times a day (TID) | ORAL | Status: DC

## 2015-06-05 MED ORDER — POLYETHYLENE GLYCOL 3350 17 G PO PACK: 17.0000 g | PACK | Freq: Two times a day (BID) | ORAL | Status: DC

## 2015-06-05 NOTE — Progress Notes (Signed)
     Subjective: 1 Day Post-Op Procedure(s) (LRB): RIGHT TOTAL HIP ARTHROPLASTY ANTERIOR APPROACH (Right)   Seen by Dr. Charlann Boxer. Patient reports pain as mild, pain controlled. No events throughout the night. Ready to be discharged home.   Objective:   VITALS:   Filed Vitals:   06/05/15 0230  BP: 116/63  Pulse: 58  Temp: 97.9 F (36.6 C)  Resp: 16    Dorsiflexion/Plantar flexion intact Incision: dressing C/D/I No cellulitis present Compartment soft  LABS  Recent Labs  06/03/15 0845 06/05/15 0518  HGB 14.3 10.7*  HCT 43.8 33.1*  WBC 5.4 8.7  PLT 239 160     Recent Labs  06/03/15 0845 06/05/15 0518  NA 135 136  K 4.4 4.3  BUN 16 12  CREATININE 0.99 0.84  GLUCOSE 105* 107*     Assessment/Plan: 1 Day Post-Op Procedure(s) (LRB): RIGHT TOTAL HIP ARTHROPLASTY ANTERIOR APPROACH (Right) Foley cath d/c'ed Advance diet Up with therapy D/C IV fluids Discharge home with home health  Follow up in 2 weeks at [facility]. Follow up with OLIN,Laretha Luepke D in 2 weeks.  Contact information:  [facility] [address] [place] [address] [phone]         Anastasio Auerbach. Avonne Berkery   PAC  06/05/2015, 9:13 AM

## 2015-06-05 NOTE — Care Management Note (Signed)
Case Management Note  Patient Details  Name: Scott Stevenson MRN: [id number] Date of Birth: 12-31-1960  Subjective/Objective:                   RIGHT TOTAL HIP ARTHROPLASTY ANTERIOR APPROACH (Right) Action/Plan: Discharge planning  Expected Discharge Date:  06/05/15               Expected Discharge Plan:  [place]  In-House Referral:     Discharge planning Services  CM Consult  Post Acute Care Choice:  Home Health Choice offered to:     DME Arranged:  3-N-1, Walker rolling DME Agency:  [place]:  PT [facility] Agency:  [place]  Status of Service:  Completed, signed off  Medicare Important Message Given:    Date Medicare IM Given:    Medicare IM give by:    Date Additional Medicare IM Given:    Additional Medicare Important Message give by:     If discussed at [place] of Stay Meetings, dates discussed:    Additional Comments: CM met with pt in room to offer choice of home health agency.  Pt chooses [facility] to render HHPT.  Address is correct but contact is [phone].  Referral geive to [employer], Tim.  CM called [facility] rep, Lecretia to please deliver the rolling walker and 3n1 to room so pt can be discharged.  No other CM needs were communicated. Dellie Catholic, RN 06/05/2015, 10:41 AM

## 2015-06-05 NOTE — Progress Notes (Signed)
Physical Therapy Treatment Patient Details Name: Terance Pomplun MRN: [id number] DOB: 1961/05/05 Today's Date: Jun 15, 2015    History of Present Illness Pt is a 54 year old male s/p R THA direct anterior approach    PT Comments    Pt ambulated in hallway and practiced steps with spouse present.  Pt also performed LE exercises.  Pt feels ready for d/c home today.  Follow Up Recommendations  Outpatient PT;Home health PT     Equipment Recommendations  Rolling walker with 5" wheels    Recommendations for Other Services       Precautions / Restrictions Precautions Precautions: None Restrictions Weight Bearing Restrictions: No Other Position/Activity Restrictions: WBAT    Mobility  Bed Mobility               General bed mobility comments: pt up in recliner  Transfers Overall transfer level: Needs assistance Equipment used: Rolling walker (2 wheeled) Transfers: Sit to/from Stand Sit to Stand: Supervision         General transfer comment: supervision for safety  Ambulation/Gait Ambulation/Gait assistance: Supervision;Min guard Ambulation Distance (Feet): 200 Feet Assistive device: Rolling walker (2 wheeled) Gait Pattern/deviations: Step-through pattern;Step-to pattern;Antalgic     General Gait Details: verbal cues for sequence, RW distance, posture, progressed to step through pattern   Stairs Stairs: Yes Stairs assistance: Min guard Stair Management: Step to pattern;Forwards;Two rails Number of Stairs: 2 General stair comments: verbal cues for safe technique, sequence, performed twice, spouse present and observed  Wheelchair Mobility    Modified Rankin (Stroke Patients Only)       Balance                                    Cognition Arousal/Alertness: Awake/alert Behavior During Therapy: WFL for tasks assessed/performed Overall Cognitive Status: Within Functional Limits for tasks assessed                      Exercises  Total Joint Exercises Ankle Circles/Pumps: AROM;Both;10 reps Hip ABduction/ADduction: AROM;Right;15 reps;Supine Straight Leg Raises: Right;10 reps;Supine;AAROM Long Arc Quad: AROM;Right;15 reps Marching in Standing: AROM;Right;15 reps;Standing (with RW)    General Comments        Pertinent Vitals/Pain Pain Assessment: 0-10 Pain Score: 2  Pain Location: R hip (incision) Pain Descriptors / Indicators: Discomfort Pain Intervention(s): Monitored during session;Limited activity within patient's tolerance;Repositioned;Ice applied;Premedicated before session    Home Living                      Prior Function            PT Goals (current goals can now be found in the care plan section) Progress towards PT goals: Progressing toward goals    Frequency  7X/week    PT Plan Current plan remains appropriate    Co-evaluation             End of Session Equipment Utilized During Treatment: Gait belt Activity Tolerance: Patient tolerated treatment well Patient left: in chair;with call bell/phone within reach;with family/visitor present     Time: 0940-1003 PT Time Calculation (min) (ACUTE ONLY): 23 min  Charges:  $Gait Training: 8-22 mins $Therapeutic Exercise: 8-22 mins                    G Codes:      Yuliet Needs,KATHrine E 06/15/2015, 12:45 PM Zenovia Jarred, PT, DPT June 15, 2015 Pager: [phone]

## 2015-06-05 NOTE — Evaluation (Signed)
Occupational Therapy Evaluation Patient Details Name: Scott Stevenson MRN: [id number] DOB: 10-08-1961 Today's Date: 06/05/2015    History of Present Illness Pt is a 54 year old male s/p R THA direct anterior approach   Clinical Impression   Pt was admitted for the above surgery. All education was completed.  No further OT is needed at this time.    Follow Up Recommendations  No OT follow up    Equipment Recommendations  None recommended by OT    Recommendations for Other Services       Precautions / Restrictions Precautions Precautions: None Restrictions Other Position/Activity Restrictions: WBAT      Mobility Bed Mobility         Supine to sit: Supervision;HOB elevated     General bed mobility comments: assisted RLE with UEs to move RLE off bed   Transfers     Transfers: Sit to/from Stand Sit to Stand: Min guard         General transfer comment: for safety, from comfort height surface; cues for UE/LE placement    Balance                                            ADL Overall ADL's : Needs assistance/impaired     Grooming: Oral care;Supervision/safety;Standing       Lower Body Bathing: Minimal assistance;Sit to/from stand       Lower Body Dressing: Minimal assistance;Sit to/from stand   Toilet Transfer: Min guard;Ambulation;Comfort height toilet             General ADL Comments: educated on getting up from comfort height commode without sink; he does have a sink in guest bath, and recommended he start with this.  He can try to get up without support before moving into master bathroom closet.  Wife present.  Educated on working within pain tolerance     Advice worker      Pertinent Vitals/Pain Pain Assessment: 0-10 Pain Score: 2  Pain Location: R hip Pain Descriptors / Indicators:  (stretching) Pain Intervention(s): Limited activity within patient's tolerance;Monitored during  session;Premedicated before session;Repositioned;Ice applied     Hand Dominance     Extremity/Trunk Assessment Upper Extremity Assessment Upper Extremity Assessment: Overall WFL for tasks assessed           Communication Communication Communication: No difficulties   Cognition Arousal/Alertness: Awake/alert Behavior During Therapy: WFL for tasks assessed/performed Overall Cognitive Status: Within Functional Limits for tasks assessed                     General Comments       Exercises       Shoulder Instructions      Home Living Family/patient expects to be discharged to:: Private residence Living Arrangements: Spouse/significant other Available Help at Discharge: Family               Bathroom Shower/Tub: Walk-in Soil scientist Toilet: Handicapped height     Home Equipment: Shower seat - built in          Prior Functioning/Environment Level of Independence: Independent             OT Diagnosis: Acute pain   OT Problem List:     OT Treatment/Interventions:      OT Goals(Current goals can be found in  the care plan section)    OT Frequency:     Barriers to D/C:            Co-evaluation              End of Session    Activity Tolerance: Patient tolerated treatment well Patient left: in chair;with call bell/phone within reach;with family/visitor present   Time: 1610-9604 OT Time Calculation (min): 20 min Charges:  OT General Charges $OT Visit: 1 Procedure OT Evaluation $Initial OT Evaluation Tier I: 1 Procedure G-Codes:    Scott Stevenson 2015/06/30, 8:59 AM  Marica Otter, OTR/L [phone] 2015-06-30

## 2015-06-10 NOTE — Discharge Summary (Signed)
Physician Discharge Summary  Patient ID: Scott Stevenson MRN: [id number] DOB/AGE: [birth year] 54 y.o.  Admit date: 06/04/2015 Discharge date: 06/05/2015   Procedures:  Procedure(s) (LRB): RIGHT TOTAL HIP ARTHROPLASTY ANTERIOR APPROACH (Right)  Attending Physician:  Dr. Durene Romans   Admission Diagnoses:   Right hip primary OA / pain  Discharge Diagnoses:  Principal Problem:   S/P right THA, AA  Past Medical History  Diagnosis Date  . Arthritis     HPI:    Scott Stevenson, 54 y.o. male, has a history of pain and functional disability in the right hip(s) due to arthritis and patient has failed non-surgical conservative treatments for greater than 12 weeks to include NSAID's and/or analgesics, corticosteriod injections and activity modification. Onset of symptoms was gradual starting 2+ years ago with gradually worsening course since that time.The patient noted no past surgery on the right hip(s). Patient currently rates pain in the right hip at 5 out of 10 with activity. Patient has worsening of pain with activity and weight bearing, trendelenberg gait, pain that interfers with activities of daily living and pain with passive range of motion. Patient has evidence of periarticular osteophytes and joint space narrowing by imaging studies. This condition presents safety issues increasing the risk of falls. There is no current active infection. Risks, benefits and expectations were discussed with the patient. Risks including but not limited to the risk of anesthesia, blood clots, nerve damage, blood vessel damage, failure of the prosthesis, infection and up to and including death. Patient understand the risks, benefits and expectations and wishes to proceed with surgery.   PCP: Eartha Inch, MD   Discharged Condition: good  Hospital Course:  Patient underwent the above stated procedure on 06/04/2015. Patient tolerated the procedure well and brought to the recovery room in good condition  and subsequently to the floor.  POD #1 BP: 116/63 ; Pulse: 58 ; Temp: 97.9 F (36.6 C) ; Resp: 16 Patient reports pain as mild, pain controlled. No events throughout the night. Ready to be discharged home.  Dorsiflexion/plantar flexion intact, incision: dressing C/D/I, no cellulitis present and compartment soft.   LABS  Basename    HGB  10.7  HCT  33.1    Discharge Exam: General appearance: alert, cooperative and no distress Extremities: Homans sign is negative, no sign of DVT, no edema, redness or tenderness in the calves or thighs and no ulcers, gangrene or trophic changes  Disposition: Home with follow up in 2 weeks   Follow-up Information    Follow up with Shelda Pal, MD. Schedule an appointment as soon as possible for a visit in 2 weeks.   Specialty:  Orthopedic Surgery   Contact information:   [address] [place] [address] [phone]       Follow up with [facility].   Why:  home health physical therapy   Contact information:   [address] [place] [address] [phone]       Follow up with [facility].   Why:  rolling walker and 3n1   Contact information:   [address] [place] [address] [phone]       Discharge Instructions    Call MD / Call 911    Complete by:  As directed   If you experience chest pain or shortness of breath, CALL 911 and be transported to the hospital emergency room.  If you develope a fever above 101 F, pus (white drainage)  or increased drainage or redness at the wound, or calf pain, call your surgeon's office.     Change dressing    Complete by:  As directed   Maintain surgical dressing until follow up in the clinic. If the edges start to pull up, may reinforce with tape. If the dressing is no longer working, may remove and cover with gauze and tape, but must keep the area dry and clean.  Call with any questions or concerns.     Constipation  Prevention    Complete by:  As directed   Drink plenty of fluids.  Prune juice may be helpful.  You may use a stool softener, such as Colace (over the counter) 100 mg twice a day.  Use MiraLax (over the counter) for constipation as needed.     Diet - low sodium heart healthy    Complete by:  As directed      Discharge instructions    Complete by:  As directed   Maintain surgical dressing until follow up in the clinic. If the edges start to pull up, may reinforce with tape. If the dressing is no longer working, may remove and cover with gauze and tape, but must keep the area dry and clean.  Follow up in 2 weeks at [facility]. Call with any questions or concerns.     Increase activity slowly as tolerated    Complete by:  As directed   Weight bearing as tolerated with assist device (walker, cane, etc) as directed, use it as long as suggested by your surgeon or therapist, typically at least 4-6 weeks.     TED hose    Complete by:  As directed   Use stockings (TED hose) for 2 weeks on both leg(s).  You may remove them at night for sleeping.             Medication List    STOP taking these medications        ibuprofen 200 MG tablet  Commonly known as:  ADVIL,MOTRIN     mupirocin ointment 2 %  Commonly known as:  BACTROBAN     naproxen sodium 220 MG tablet  Commonly known as:  ANAPROX      TAKE these medications        aspirin EC 325 MG tablet  Take 1 tablet (325 mg total) by mouth 2 (two) times daily. Take for 4 weeks.     docusate sodium 100 MG capsule  Commonly known as:  COLACE  Take 1 capsule (100 mg total) by mouth 2 (two) times daily.     ferrous sulfate 325 (65 FE) MG tablet  Take 1 tablet (325 mg total) by mouth 3 (three) times daily after meals.     HYDROcodone-acetaminophen 7.5-325 MG per tablet  Commonly known as:  NORCO  Take 1-2 tablets by mouth every 4 (four) hours as needed for moderate pain.     JUICE PLUS FIBRE PO  Take 2 tablets by mouth  every morning. Veggie     JUICE PLUS FIBRE PO  Take 2 tablets by mouth every morning. Fruit     methocarbamol 500 MG tablet  Commonly known as:  ROBAXIN  Take 1 tablet (500 mg total) by mouth every 6 (six) hours as needed for muscle spasms.     polyethylene glycol packet  Commonly known as:  MIRALAX / GLYCOLAX  Take 17 g by mouth 2 (two) times daily.         Signed:  Anastasio Auerbach Shanoah Asbill   PA-C  06/10/2015, 2:22 PM

## 2016-08-09 IMAGING — DX DG HIP (WITH OR WITHOUT PELVIS) 1V PORT*R*
2 series · 2 of 2 positions shown · non-contrast
Comparison: None.

CLINICAL DATA: Status post right hip replacement

EXAM:
DG HIP (WITH OR WITHOUT PELVIS) 1V PORT RIGHT

[pelvis ap]
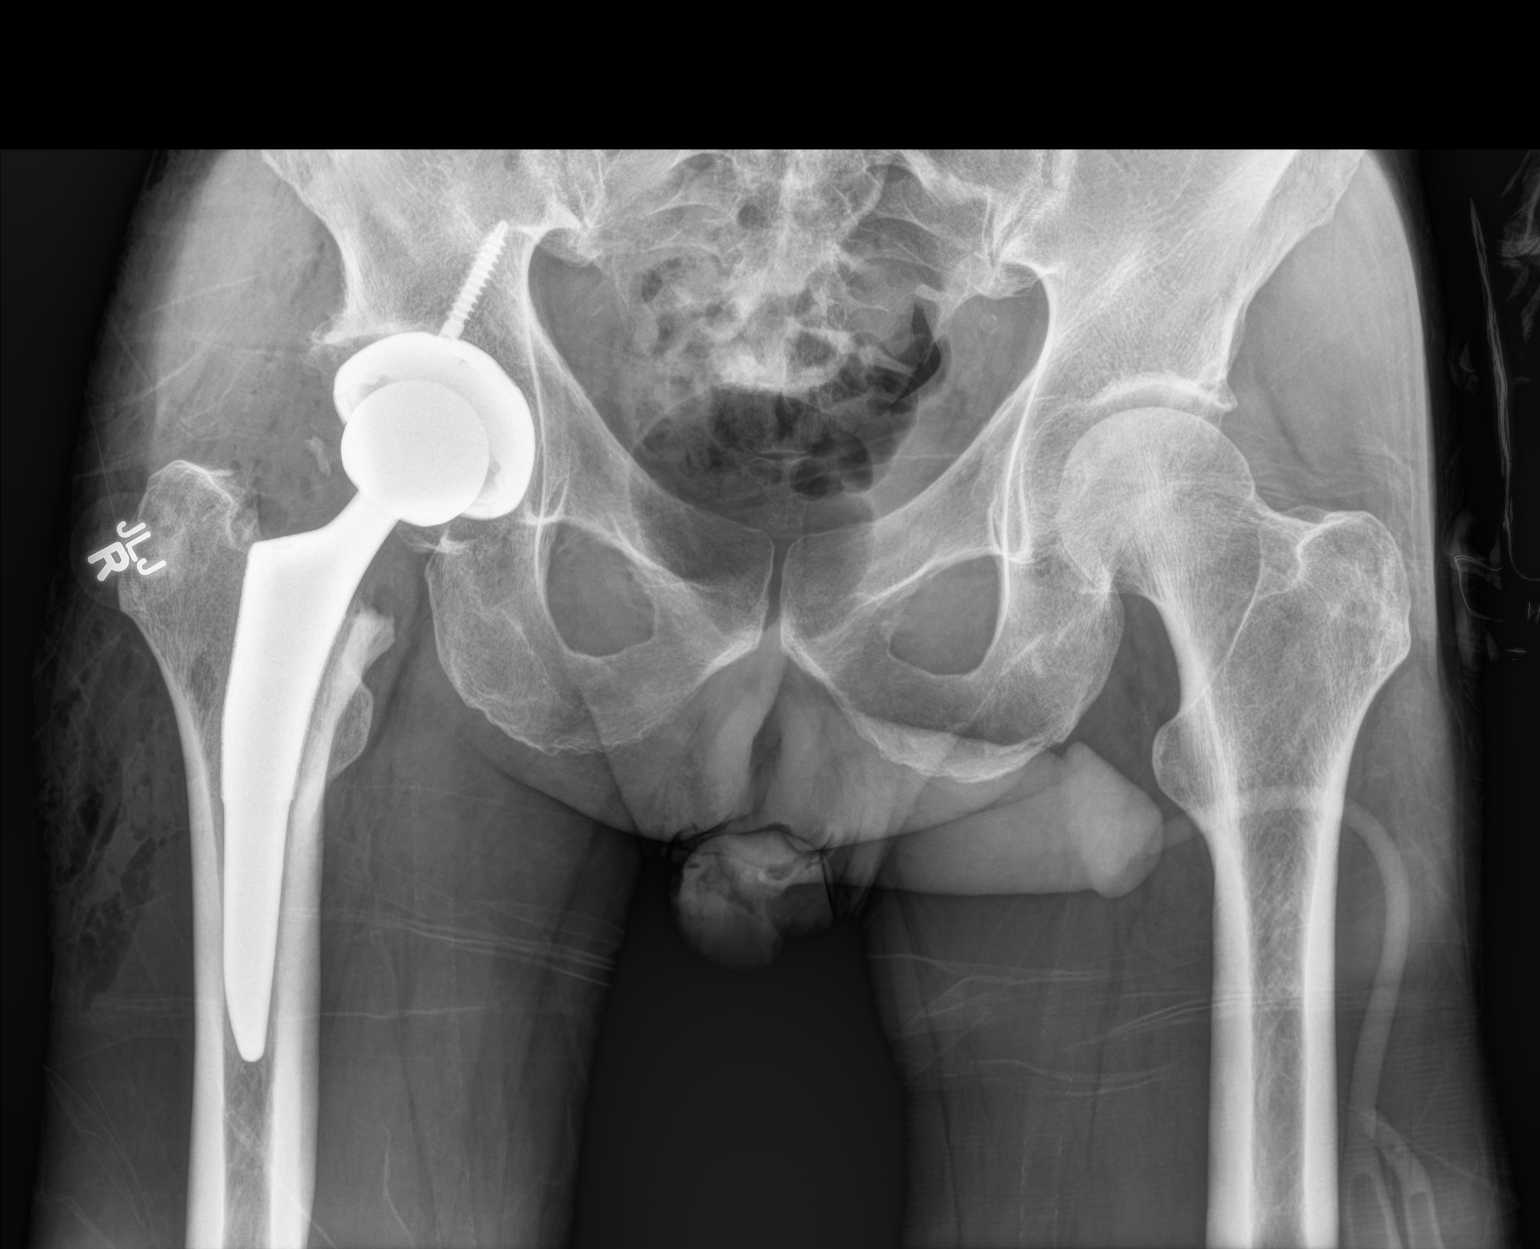

[hip x-table]
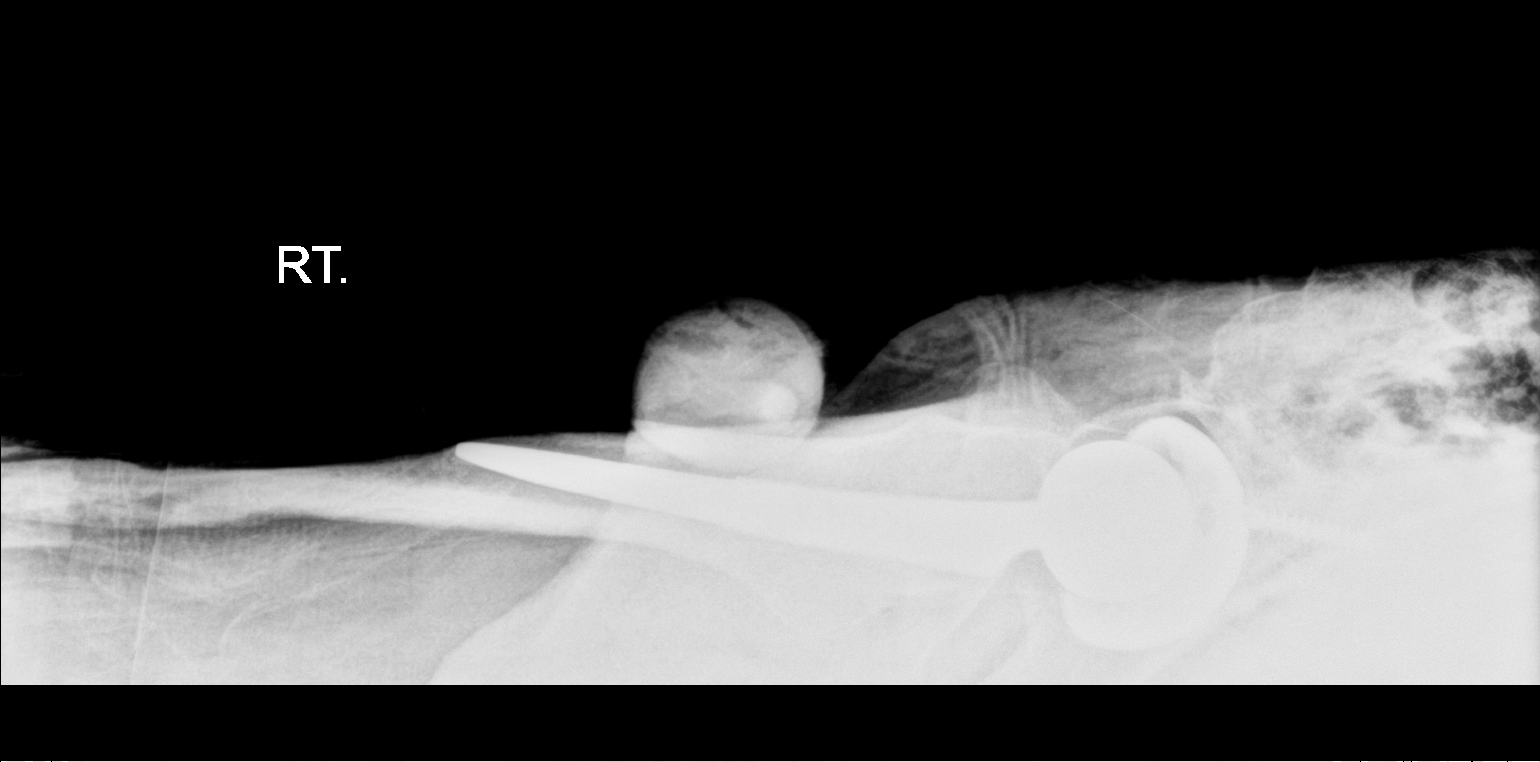

[2 of 2 positions shown; findings below may reference images not displayed]

FINDINGS: A right hip replacement is seen in satisfactory position. No acute
bony abnormality is noted.
IMPRESSION: Status post right hip replacement

## 2019-05-26 ENCOUNTER — Other Ambulatory Visit: Payer: Self-pay

## 2019-05-26 DIAGNOSIS — Z20822 Contact with and (suspected) exposure to covid-19: Secondary | ICD-10-CM

## 2019-05-28 LAB — NOVEL CORONAVIRUS, NAA: SARS-CoV-2, NAA: NOT DETECTED

## 2019-12-15 ENCOUNTER — Ambulatory Visit: Payer: BC Managed Care – PPO | Attending: Internal Medicine

## 2019-12-15 DIAGNOSIS — Z23 Encounter for immunization: Secondary | ICD-10-CM | POA: Insufficient documentation

## 2019-12-15 NOTE — Progress Notes (Signed)
   Covid-19 Vaccination Clinic  Name:  Kainon Varady    MRN: [id number] DOB: Jul 26, 1961  12/15/2019  Mr. Dunavant was observed post Covid-19 immunization for 15 minutes without incidence. He was provided with Vaccine Information Sheet and instruction to access the V-Safe system.   Mr. Alfieri was instructed to call 911 with any severe reactions post vaccine: Marland Kitchen Difficulty breathing  . Swelling of your face and throat  . A fast heartbeat  . A bad rash all over your body  . Dizziness and weakness    Immunizations Administered    Name Date Dose VIS Date Route   Pfizer COVID-19 Vaccine 12/15/2019  1:30 PM 0.3 mL 10/06/2019 Intramuscular   Manufacturer: [employer], [employer]   Lot: [id number]   NDC: [address]-6

## 2020-01-09 ENCOUNTER — Ambulatory Visit: Payer: BC Managed Care – PPO | Attending: Internal Medicine

## 2020-01-09 DIAGNOSIS — Z23 Encounter for immunization: Secondary | ICD-10-CM

## 2020-01-09 NOTE — Progress Notes (Signed)
   Covid-19 Vaccination Clinic  Name:  Keyshaun Exley    MRN: [id number] DOB: 05-08-1961  01/09/2020  Mr. Mccabe was observed post Covid-19 immunization for 15 minutes without incident. He was provided with Vaccine Information Sheet and instruction to access the V-Safe system.   Mr. Carpenito was instructed to call 911 with any severe reactions post vaccine: Marland Kitchen Difficulty breathing  . Swelling of face and throat  . A fast heartbeat  . A bad rash all over body  . Dizziness and weakness   Immunizations Administered    Name Date Dose VIS Date Route   Pfizer COVID-19 Vaccine 01/09/2020  8:27 AM 0.3 mL 10/06/2019 Intramuscular   Manufacturer: [employer], [employer]   Lot: [id number]   NDC: [address]-9

## 2020-02-12 ENCOUNTER — Ambulatory Visit: Payer: BC Managed Care – PPO | Admitting: Family Medicine

## 2020-02-12 ENCOUNTER — Other Ambulatory Visit: Payer: Self-pay

## 2020-02-12 ENCOUNTER — Encounter: Payer: Self-pay | Admitting: Family Medicine

## 2020-02-12 DIAGNOSIS — M79605 Pain in left leg: Secondary | ICD-10-CM | POA: Diagnosis not present

## 2020-02-12 MED ORDER — BACLOFEN 10 MG PO TABS: 5.0000 mg | ORAL_TABLET | Freq: Three times a day (TID) | ORAL | 3 refills | Status: DC | PRN

## 2020-02-12 MED ORDER — TRAMADOL HCL 50 MG PO TABS: 50.0000 mg | ORAL_TABLET | Freq: Four times a day (QID) | ORAL | 0 refills | Status: DC | PRN

## 2020-02-12 MED ORDER — MELOXICAM 15 MG PO TABS: 7.5000 mg | ORAL_TABLET | Freq: Every day | ORAL | 6 refills | Status: DC | PRN

## 2020-02-12 NOTE — Progress Notes (Signed)
Office Visit Note   Patient: Scott Stevenson           Date of Birth: 24-Jan-1961           MRN: [id number] Visit Date: 02/12/2020 Requested by: Chesley Noon, MD [place],  [place] [address] PCP: Chesley Noon, MD  Subjective: Chief Complaint  Patient presents with  . Left Leg - Pain    Pain in upper leg since lifting something 02/02/20. Pain mostly lateral hip/thigh and upper anterior thigh. Some numbness occasionally in left lower leg. Went to [employer] over weekend (brought CD & paperwork).    HPI: He is here with left leg pain.  He is the husband of [place].    On April 9 he was moving a box of books.  His mother recently passed away at age 108.  He bent forward awkwardly and felt a twinge of pain in the left posterior hip.  Pain got progressively worse over the next couple days, to the point that he could hardly stand up straight.  He is now using a walker which he had from his left hip replacement surgery in 2016.  As long as he does not stand completely upright, or if he sits slightly bent forward, his pain is manageable.  He recently went to [facility] where x-rays of his left hip were unremarkable, x-rays of his lumbar spine showed moderate T12-L1 degenerative disc disease per my interpretation.  He brought the disc with him today.  He was given prednisone, Depo-Medrol injection, Flexeril.  He has been doing some physical therapy exercises.  He cannot seem to get any relief.  He feels a numbness and tingling sensation in his left leg.  Denies any bowel or bladder dysfunction.  His ankle feels weak.              ROS:   All other systems were reviewed and are negative.  Objective: Vital Signs: There were no vitals taken for this visit.  Physical Exam:  General:  Alert and oriented, in no acute distress. Pulm:  Breathing unlabored. Psy:  Normal mood, congruent affect. Skin: No rash Low back: He has tenderness to the left of midline at around L5 in the  paraspinous muscles.  He is also tender in the left sciatic notch.  No pain with internal hip rotation, no tenderness over the greater trochanter.  Lower extremity strength and reflexes are normal except for ankle dorsiflexion which is weak at 3/5 compared to normal strength on the right.  Imaging: None today  Assessment & Plan: 1.  Left sided sciatica with ankle dorsiflexion weakness, concerning for lumbar disc herniation -We will give him some different medications, referral to [facility]. -If he fails to improve, then lumbar MRI scan followed by epidural injection versus surgical consult depending on the findings.     Procedures: No procedures performed  No notes on file     PMFS History: Patient Active Problem List   Diagnosis Date Noted  . 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 Arthritis     History reviewed. No pertinent family history.  Past Surgical History:  Procedure Laterality Date  . TOTAL HIP ARTHROPLASTY Right 06/04/2015   Procedure: RIGHT TOTAL HIP ARTHROPLASTY ANTERIOR APPROACH;  Surgeon: Paralee Cancel, MD;  Location: [facility];  Service: Orthopedics;  Laterality: Right;  . WISDOM TOOTH EXTRACTION     Social History   Occupational History  . Not  on file  Tobacco Use  . Smoking status: Never Smoker  Substance and Sexual Activity  . Alcohol use: Yes    Comment: occasional  . Drug use: No  . Sexual activity: Not on file

## 2020-03-11 ENCOUNTER — Encounter: Payer: Self-pay | Admitting: Family Medicine

## 2020-04-18 ENCOUNTER — Encounter: Payer: Self-pay | Admitting: Family Medicine

## 2020-04-18 ENCOUNTER — Other Ambulatory Visit: Payer: Self-pay

## 2020-04-18 ENCOUNTER — Ambulatory Visit (INDEPENDENT_AMBULATORY_CARE_PROVIDER_SITE_OTHER): Payer: BC Managed Care – PPO | Admitting: Family Medicine

## 2020-04-18 VITALS — BP 115/66 | HR 81 | Ht 71.75 in | Wt 162.0 lb

## 2020-04-18 DIAGNOSIS — I499 Cardiac arrhythmia, unspecified: Secondary | ICD-10-CM

## 2020-04-18 DIAGNOSIS — Z860100 Personal history of colon polyps, unspecified: Secondary | ICD-10-CM

## 2020-04-18 DIAGNOSIS — Z0000 Encounter for general adult medical examination without abnormal findings: Secondary | ICD-10-CM

## 2020-04-18 DIAGNOSIS — L989 Disorder of the skin and subcutaneous tissue, unspecified: Secondary | ICD-10-CM

## 2020-04-18 DIAGNOSIS — R739 Hyperglycemia, unspecified: Secondary | ICD-10-CM

## 2020-04-18 DIAGNOSIS — Z820 Family history of epilepsy and other diseases of the nervous system: Secondary | ICD-10-CM

## 2020-04-18 DIAGNOSIS — R413 Other amnesia: Secondary | ICD-10-CM

## 2020-04-18 NOTE — Progress Notes (Signed)
Office Visit Note   Patient: Scott Stevenson           Date of Birth: 10-20-1961           MRN: [id number] Visit Date: 04/18/2020 Requested by: Scott Noon, MD [address] [place],  [place] [address] PCP: Scott Blase, MD  Subjective: Chief Complaint  Patient presents with  . Annual Exam    HPI: He is here for a wellness exam.  His last one was 2019.  His main concern is that he has a family history of dementia.  His mother developed it at age 60 and died this year at age 34.  His sister died of what was called Alzheimer's type dementia at age 34.  His father also had dementia and died in his 42s.  Patient has occasional episodes of forgetfulness but nothing that seems excessive.  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  He has a history of colon cancer in his sister.  Patient has had colon polyps and has had colonoscopy in 2014 and 2019.  He is getting them every 5 years.  Previous physical labs showed elevated blood glucose and hemoglobin A1c of 5.7.  He does not follow any specific diet.  He admits to drinking 2-4 beers per night to help wind down.  He is a non-smoker.  He exercises every morning.  He sleeps only about 5 hours per night.               ROS: Denies any skin, hair, or nail changes.  Denies any chest pain, shortness of breath, palpitations.  Denies any acid reflux, constipation, diarrhea.  All other systems were reviewed and are negative.  Objective: Vital Signs: BP 115/66   Pulse 81   Ht 5' 11.75" (1.822 m)   Wt 162 lb (73.5 kg)   BMI 22.12 kg/m   Physical Exam:  General:  Alert and oriented, in no acute distress. Pulm:  Breathing unlabored. Psy:  Normal mood, congruent affect. Skin: There is an erythematous lesion in the right temple area which could be an actinic keratosis.  No other worrisome lesions seen. HEENT:  [place]/AT, PERRLA, EOM Full, no  nystagmus.  Funduscopic examination within normal limits.  No conjunctival erythema.  Tympanic membranes are pearly gray with normal landmarks.  External ear canals are normal.  Nasal passages are clear.  Oropharynx is clear.  No significant lymphadenopathy.  No thyromegaly or nodules.  2+ carotid pulses without bruits. CV: I believe his rhythm is regular, but some of the beats are slightly slower which could be physiologic.  He is without murmurs, rubs, or gallops.  No peripheral edema.  2+ radial and posterior tibial pulses. Lungs: Clear to auscultation throughout with no wheezing or areas of consolidation. Abd: Bowel sounds are active, no hepatosplenomegaly or masses.  Soft and nontender.  No audible bruits.  No evidence of ascites. Extremities: 2+ upper and lower DTRs, no nail deformities.    Imaging: No results found.  Assessment & Plan: 1.  Wellness examination -Labs to evaluate, including labs to look for potential causes of dementia. -Follow-up yearly for wellness exams.  2.  Possible irregular heart rate -We will order an EKG as a baseline.  3.  Family history of dementia with occasional partial episodes of memory change -Labs drawn as above.  I will also make some dietary and lifestyle  recommendations including getting more sleep, decreasing alcohol intake.  4.  History of colon polyps -Colonoscopy every 5 years.  5.  Skin lesion of face -Dermatology consult for full body skin check.     Procedures: No procedures performed  No notes on file     PMFS History: Patient Active Problem List   Diagnosis Date Noted  . History of colon polyps 04/18/2020  . Family history of Alzheimer's disease 04/18/2020  . 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 Arthritis     Family History  Problem Relation Age of Onset  . Alzheimer's disease Mother   . Cancer Mother   . Breast cancer Mother   . Dementia Father   . Colon cancer Sister   .  Cancer Sister   . Alzheimer's disease Sister   . Thyroid disease Sister        Not sure of type?  Marland Kitchen Healthy Brother   . Diabetes Neg Hx   . Prostate cancer Neg Hx   . Heart disease Neg Hx     Past Surgical History:  Procedure Laterality Date  . TOTAL HIP ARTHROPLASTY Right 06/04/2015   Procedure: RIGHT TOTAL HIP ARTHROPLASTY ANTERIOR APPROACH;  Surgeon: Scott Romans, MD;  Location: [facility];  Service: Orthopedics;  Laterality: Right;  . WISDOM TOOTH EXTRACTION     Social History   Occupational History  . Not on file  Tobacco Use  . Smoking status: Never Smoker  Substance and Sexual Activity  . Alcohol use: Yes    Comment: occasional  . Drug use: No  . Sexual activity: Not on file

## 2020-04-18 NOTE — Patient Instructions (Addendum)
    Recommend:  - Dermatologist for full body skin check and evaluation of spot on right temple area.  - EKG as a baseline (will figure out location and let you know).  - I will email information about diet and lifestyle changes for Alzheimer's prevention.

## 2020-04-19 LAB — INSULIN, RANDOM: Insulin: 4 u[IU]/mL

## 2020-04-23 ENCOUNTER — Telehealth: Payer: Self-pay | Admitting: Family Medicine

## 2020-04-23 LAB — LIPID PANEL
Cholesterol: 189 mg/dL (ref <200)
HDL: 71 mg/dL (ref >=40)
LDL Cholesterol (Calc): 101 mg/dL (calc) — ABNORMAL HIGH
Non-HDL Cholesterol (Calc): 118 mg/dL (calc) (ref <130)
Total CHOL/HDL Ratio: 2.7 (calc) (ref <5.0)
Triglycerides: 76 mg/dL (ref <150)

## 2020-04-23 LAB — THYROID PANEL WITH TSH
Free Thyroxine Index: 2.4 (ref 1.4–3.8)
T3 Uptake: 38 % — ABNORMAL HIGH (ref 22–35)
T4, Total: 6.3 ug/dL (ref 4.9–10.5)
TSH: 1.7 mIU/L (ref 0.40–4.50)

## 2020-04-23 LAB — COMPREHENSIVE METABOLIC PANEL
AG Ratio: 1.5 (calc) (ref 1.0–2.5)
ALT: 10 U/L (ref 9–46)
AST: 18 U/L (ref 10–35)
Albumin: 4.1 g/dL (ref 3.6–5.1)
Alkaline phosphatase (APISO): 63 U/L (ref 35–144)
BUN: 13 mg/dL (ref 7–25)
CO2: 26 mmol/L (ref 20–32)
Calcium: 9 mg/dL (ref 8.6–10.3)
Chloride: 102 mmol/L (ref 98–110)
Creat: 0.9 mg/dL (ref 0.70–1.33)
Globulin: 2.7 g/dL (calc) (ref 1.9–3.7)
Glucose, Bld: 93 mg/dL (ref 65–99)
Potassium: 4.7 mmol/L (ref 3.5–5.3)
Sodium: 138 mmol/L (ref 135–146)
Total Bilirubin: 0.6 mg/dL (ref 0.2–1.2)
Total Protein: 6.8 g/dL (ref 6.1–8.1)

## 2020-04-23 LAB — CBC WITH DIFFERENTIAL/PLATELET
Absolute Monocytes: 559 cells/uL (ref 200–950)
Basophils Absolute: 20 cells/uL (ref 0–200)
Basophils Relative: 0.3 %
Eosinophils Absolute: 72 cells/uL (ref 15–500)
Eosinophils Relative: 1.1 %
HCT: 38.2 % — ABNORMAL LOW (ref 38.5–50.0)
Hemoglobin: 13 g/dL — ABNORMAL LOW (ref 13.2–17.1)
Lymphs Abs: 1164 cells/uL (ref 850–3900)
MCH: 33.2 pg — ABNORMAL HIGH (ref 27.0–33.0)
MCHC: 34 g/dL (ref 32.0–36.0)
MCV: 97.7 fL (ref 80.0–100.0)
MPV: 11.2 fL (ref 7.5–12.5)
Monocytes Relative: 8.6 %
Neutro Abs: 4687 cells/uL (ref 1500–7800)
Neutrophils Relative %: 72.1 %
Platelets: 248 10*3/uL (ref 140–400)
RBC: 3.91 10*6/uL — ABNORMAL LOW (ref 4.20–5.80)
RDW: 12.3 % (ref 11.0–15.0)
Total Lymphocyte: 17.9 %
WBC: 6.5 10*3/uL (ref 3.8–10.8)

## 2020-04-23 LAB — ANA: Anti Nuclear Antibody (ANA): POSITIVE — AB

## 2020-04-23 LAB — HEMOGLOBIN A1C
Hgb A1c MFr Bld: 5.5 % of total Hgb (ref <5.7)
Mean Plasma Glucose: 111 (calc)
eAG (mmol/L): 6.2 (calc)

## 2020-04-23 LAB — VITAMIN D 25 HYDROXY (VIT D DEFICIENCY, FRACTURES): Vit D, 25-Hydroxy: 18 ng/mL — ABNORMAL LOW (ref 30–100)

## 2020-04-23 LAB — TESTOSTERONE TOTAL,FREE,BIO, MALES
Albumin: 4.1 g/dL (ref 3.6–5.1)
Sex Hormone Binding: 51 nmol/L (ref 22–77)
Testosterone, Bioavailable: 63 ng/dL — ABNORMAL LOW (ref >=110.0)
Testosterone, Free: 33.5 pg/mL — ABNORMAL LOW (ref 46.0–224.0)
Testosterone: 369 ng/dL (ref 250–827)

## 2020-04-23 LAB — ANTI-NUCLEAR AB-TITER (ANA TITER): ANA Titer 1: 1:40 {titer} — ABNORMAL HIGH

## 2020-04-23 LAB — HIGH SENSITIVITY CRP: hs-CRP: <0.3 mg/L

## 2020-04-23 LAB — PSA: PSA: 0.5 ng/mL (ref <=4.0)

## 2020-04-23 NOTE — Telephone Encounter (Signed)
Labs are notable for the following:  Hemoglobin A1c and insulin levels are normal.  Fasting glucose is also normal.  Vitamin D is very low at 18.  We want this to be 50-80.  I recommend taking vitamin D3, 5000 IU tablets, 2 of them daily for 3 months and then 1 daily long-term after that.  We will recheck in about 6 months.  Thyroid studies are essentially normal.  PSA looks perfect.  Lipid panel looks very good overall.  CBC shows some slight abnormalities.  All of the abnormal results are borderline.  I do not think we need to worry about this right now, but should recheck it in a couple months to be sure it is back in normal range.  ANA level, autoimmune marker, is elevated at 1: 40.  This is a nonspecific test.  Your wife, as you know, has this as well.  Often making lifestyle and diet changes can help to normalize this.  We will recheck in 3-6 months.  Total testosterone looks good, but bioavailable is low at 63.  This will also often improve with dietary changes.  Recheck in 3-6 months.

## 2020-05-09 ENCOUNTER — Encounter: Payer: Self-pay | Admitting: Physician Assistant

## 2020-05-09 ENCOUNTER — Other Ambulatory Visit: Payer: Self-pay

## 2020-05-09 ENCOUNTER — Ambulatory Visit: Payer: BC Managed Care – PPO | Admitting: Physician Assistant

## 2020-05-09 DIAGNOSIS — L719 Rosacea, unspecified: Secondary | ICD-10-CM | POA: Diagnosis not present

## 2020-05-09 DIAGNOSIS — L570 Actinic keratosis: Secondary | ICD-10-CM | POA: Diagnosis not present

## 2020-05-09 MED ORDER — METRONIDAZOLE 0.75 % EX CREA: TOPICAL_CREAM | Freq: Two times a day (BID) | CUTANEOUS | 6 refills | Status: AC

## 2020-05-09 NOTE — Progress Notes (Signed)
   New Patient   Subjective  Scott Stevenson is a 59 y.o. male who presents for the following: Annual Exam (right side of face- pink & scaly x months). Red face with acne.  The following portions of the chart were reviewed this encounter and updated as appropriate: Tobacco  Allergies  Meds  Problems  Med Hx  Surg Hx  Fam Hx      Objective  Well appearing patient in no apparent distress; mood and affect are within normal limits.  A focused examination was performed including face, scalp. Relevant physical exam findings are noted in the Assessment and Plan.  Objective  Right Temporal Scalp: Erythematous patches with gritty scale.  Objective  Mid Forehead: Centrifacial erythema with papules/pustules.   Assessment & Plan  AK (actinic keratosis) Right Temporal Scalp  Destruction of lesion - Right Temporal Scalp Complexity: simple   Destruction method: cryotherapy   Informed consent: discussed and consent obtained   Timeout:  patient name, date of birth, surgical site, and procedure verified Lesion destroyed using liquid nitrogen: Yes   Cryotherapy cycles:  1 Outcome: patient tolerated procedure well with no complications   Post-procedure details: wound care instructions given    Rosacea Mid Forehead  Ordered Medications: metroNIDAZOLE (METROCREAM) 0.75 % cream

## 2021-06-20 ENCOUNTER — Ambulatory Visit (INDEPENDENT_AMBULATORY_CARE_PROVIDER_SITE_OTHER): Payer: BC Managed Care – PPO | Admitting: Family Medicine

## 2021-06-20 ENCOUNTER — Other Ambulatory Visit: Payer: Self-pay

## 2021-06-20 ENCOUNTER — Encounter: Payer: Self-pay | Admitting: Family Medicine

## 2021-06-20 VITALS — BP 127/72 | HR 78 | Temp 98.5°F

## 2021-06-20 DIAGNOSIS — R1032 Left lower quadrant pain: Secondary | ICD-10-CM | POA: Diagnosis not present

## 2021-06-20 MED ORDER — SULFAMETHOXAZOLE-TRIMETHOPRIM 800-160 MG PO TABS: 1.0000 | ORAL_TABLET | Freq: Two times a day (BID) | ORAL | 0 refills | Status: DC

## 2021-06-20 MED ORDER — HYDROCODONE-ACETAMINOPHEN 5-325 MG PO TABS: 1.0000 | ORAL_TABLET | Freq: Four times a day (QID) | ORAL | 0 refills | Status: DC | PRN

## 2021-06-20 MED ORDER — TAMSULOSIN HCL 0.4 MG PO CAPS: 0.4000 mg | ORAL_CAPSULE | Freq: Every day | ORAL | 0 refills | Status: DC

## 2021-06-20 NOTE — Progress Notes (Signed)
Office Visit Note   Patient: Scott Stevenson           Date of Birth: 04/30/1961           MRN: [id number] Visit Date: 06/20/2021 Requested by: Lavada Mesi, MD [address] [place],  [place] [address] PCP: Lavada Mesi, MD  Subjective: Chief Complaint  Patient presents with   Lower Back - Pain    Started having pain in the left lower back last weekend while at his cabin in [place]. The pain worsened during the night and the vomited "bile and mucus." Having left flank pain and nausea in the night hours since then.     HPI: He is here with left flank pain.  Symptoms started about 4 to 5 days ago.  He was at his cabin in [place] when he began feeling this pain.  It became so intense that he vomited a couple times.  Denies fevers or chills.  Denies dysuria or hematuria.  He has never had a kidney stone but his brother has had them.  He is feeling a little bit better but the pain is still not gone.  No injury to account for this.                ROS:   All other systems were reviewed and are negative.  Objective: Vital Signs: BP 127/72 (BP Location: Left Arm, Patient Position: Sitting, Cuff Size: Normal)   Pulse 78   Temp 98.5 F (36.9 C)   Physical Exam:  General:  Alert and oriented, in no acute distress. Pulm:  Breathing unlabored. Psy:  Normal mood, congruent affect. Skin: No rash Low back: Positive CVA tenderness.  No abdominal tenderness.   Urine dipstick: He was not able to reproduce much urine at all.  We tested what he provided and there was no hematuria.     Assessment & Plan: Left flank pain, suspicious for nephrolithiasis. -He will increase fluid intake, start Flomax.  Hydrocodone as needed for more severe pain.  If not any better at the beginning of the week, then CT scan.  If kidney stone confirmed, then urology consult.     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   Healthy Brother    Diabetes Neg Hx    Prostate cancer Neg Hx    Heart disease Neg Hx     Past Surgical History:  Procedure Laterality Date   TOTAL HIP ARTHROPLASTY Right 06/04/2015   Procedure: RIGHT TOTAL HIP ARTHROPLASTY ANTERIOR APPROACH;  Surgeon: Durene Romans, MD;  Location: [facility];  Service: Orthopedics;  Laterality: Right;   WISDOM TOOTH EXTRACTION     Social History   Occupational History   Not on file  Tobacco Use   Smoking status: Never   Smokeless tobacco: Never  Substance and Sexual Activity  Alcohol use: Yes    Comment: occasional   Drug use: No   Sexual activity: Not on file

## 2021-07-04 ENCOUNTER — Other Ambulatory Visit: Payer: Self-pay | Admitting: Family Medicine

## 2021-07-28 ENCOUNTER — Ambulatory Visit: Payer: BC Managed Care – PPO | Admitting: Urology

## 2021-08-11 ENCOUNTER — Encounter: Payer: Self-pay | Admitting: Urology

## 2021-08-11 ENCOUNTER — Other Ambulatory Visit: Payer: Self-pay

## 2021-08-11 ENCOUNTER — Ambulatory Visit: Payer: BC Managed Care – PPO | Admitting: Urology

## 2021-08-11 VITALS — BP 135/83 | HR 70 | Temp 98.6°F | Wt 160.2 lb

## 2021-08-11 DIAGNOSIS — N5201 Erectile dysfunction due to arterial insufficiency: Secondary | ICD-10-CM

## 2021-08-11 DIAGNOSIS — R1032 Left lower quadrant pain: Secondary | ICD-10-CM | POA: Diagnosis not present

## 2021-08-11 LAB — URINALYSIS, ROUTINE W REFLEX MICROSCOPIC
Bilirubin, UA: NEGATIVE
Glucose, UA: NEGATIVE
Leukocytes,UA: NEGATIVE
Nitrite, UA: NEGATIVE
Protein,UA: NEGATIVE
RBC, UA: NEGATIVE
Specific Gravity, UA: 1.025 (ref 1.005–1.030)
Urobilinogen, Ur: 0.2 mg/dL (ref 0.2–1.0)
pH, UA: 5.5 (ref 5.0–7.5)

## 2021-08-11 MED ORDER — SILDENAFIL CITRATE 20 MG PO TABS: 20.0000 mg | ORAL_TABLET | Freq: Every day | ORAL | 11 refills | Status: DC

## 2021-08-11 NOTE — Progress Notes (Signed)
08/11/2021 3:27 PM   Scott Stevenson 1960-11-01 [id number]  Referring provider: Lavada Mesi, MD [address] [place],  [place] [address]  No chief complaint on file.   HPI:  New patient-  1) left flank pain-He had severe left flank pain August 2022. Assoc with N/V.  No history of stones.  No gross hematuria. He has not seen a stone pass. Dr. Prince Rome ordered a CT scan of the abdomen and pelvis with and without contrast. Pain resolved. UA clear today.   2) prostate cancer screening-patient has no urinary complaints.  His AUA symptom score is 0. His 06/21 PSA was 0.5.   3) ED - for about a year trouble getting and maintaining erection. Normal libido.   Today, seen for the above.   He works as a Stage manager.    PMH: Past Medical History:  Diagnosis Date   Arthritis     Surgical History: Past Surgical History:  Procedure Laterality Date   TOTAL HIP ARTHROPLASTY Right 06/04/2015   Procedure: RIGHT TOTAL HIP ARTHROPLASTY ANTERIOR APPROACH;  Surgeon: Durene Romans, MD;  Location: [facility];  Service: Orthopedics;  Laterality: Right;   WISDOM TOOTH EXTRACTION      Home Medications:  Allergies as of 08/11/2021   No Known Allergies      Medication List        Accurate as of August 11, 2021  3:27 PM. If you have any questions, ask your nurse or doctor.          FISH OIL PO Take by mouth daily.   HYDROcodone-acetaminophen 5-325 MG tablet Commonly known as: NORCO/VICODIN Take 1 tablet by mouth every 6 (six) hours as needed for moderate pain.   JUICE PLUS FIBRE PO Take 2 tablets by mouth every morning. Fruit   sulfamethoxazole-trimethoprim 800-160 MG tablet Commonly known as: BACTRIM DS Take 1 tablet by mouth 2 (two) times daily.   tamsulosin 0.4 MG Caps capsule Commonly known as: FLOMAX Take 1 capsule (0.4 mg total) by mouth daily.        Allergies: No Known Allergies  Family History: Family History  Problem Relation Age of Onset    Alzheimer's disease Mother    Cancer Mother    Breast cancer Mother    Dementia Father    Colon cancer Sister    Cancer Sister    Alzheimer's disease Sister    Thyroid disease Sister        Not sure of type?   Healthy Brother    Diabetes Neg Hx    Prostate cancer Neg Hx    Heart disease Neg Hx     Social History:  reports that he has never smoked. He has never used smokeless tobacco. He reports current alcohol use. He reports that he does not use drugs.   Physical Exam: BP 135/83   Pulse 70   Temp 98.6 F (37 C)   Wt 160 lb 3.2 oz (72.7 kg)   BMI 21.88 kg/m   Constitutional:  Alert and oriented, No acute distress. HEENT: [place] AT, moist mucus membranes.  Trachea midline, no masses. Cardiovascular: No clubbing, cyanosis, or edema. Respiratory: Normal respiratory effort, no increased work of breathing. GI: Abdomen is soft, nontender, nondistended, no abdominal masses GU: No CVA tenderness Lymph: No inguinal lymphadenopathy. Skin: No rashes, bruises or suspicious lesions. Neurologic: Grossly intact, no focal deficits, moving all 4 extremities. Psychiatric: Normal mood and affect. GU: Penis circumcised, normal foreskin, testicles descended bilaterally and palpably normal, bilateral epididymis  palpably normal, scrotum normal DRE: Prostate 40 g, smooth without hard area or nodule   Laboratory Data: Lab Results  Component Value Date   WBC 6.5 04/18/2020   HGB 13.0 (L) 04/18/2020   HCT 38.2 (L) 04/18/2020   MCV 97.7 04/18/2020   PLT 248 04/18/2020    Lab Results  Component Value Date   CREATININE 0.90 04/18/2020    Lab Results  Component Value Date   PSA 0.5 04/18/2020    Lab Results  Component Value Date   TESTOSTERONE 369 04/18/2020    Lab Results  Component Value Date   HGBA1C 5.5 04/18/2020    Urinalysis    Component Value Date/Time   COLORURINE YELLOW 06/03/2015 0807   APPEARANCEUR CLEAR 06/03/2015 0807   LABSPEC 1.013 06/03/2015 0807   PHURINE 5.5  06/03/2015 0807   GLUCOSEU NEGATIVE 06/03/2015 0807   HGBUR NEGATIVE 06/03/2015 0807   BILIRUBINUR NEGATIVE 06/03/2015 0807   KETONESUR NEGATIVE 06/03/2015 0807   PROTEINUR NEGATIVE 06/03/2015 0807   UROBILINOGEN 0.2 06/03/2015 0807   NITRITE NEGATIVE 06/03/2015 0807   LEUKOCYTESUR NEGATIVE 06/03/2015 0807    No results found for: LABMICR, WBCUA, RBCUA, LABEPIT, MUCUS, BACTERIA  Pertinent Imaging: N/a   Assessment & Plan:    1. Left lower quadrant abdominal pain His UA and exam are normal. Symptoms resolved. Notify me or PCP if any changes.  - Urinalysis, Routine w reflex microscopic  2. ED - disc nature r/b/a to sildenafil.   No follow-ups on file.  Jerilee Field, MD  [facility]  [address] [place], [place] [address] [phone]

## 2021-08-11 NOTE — Progress Notes (Signed)
Urological Symptom Review  Patient is experiencing the following symptoms: Kidney stones   Review of Systems  Gastrointestinal (upper)  : Negative for upper GI symptoms  Gastrointestinal (lower) : Negative for lower GI symptoms  Constitutional : Negative for symptoms  Skin: Negative for skin symptoms  Eyes: Negative for eye symptoms  Ear/Nose/Throat : Negative for Ear/Nose/Throat symptoms  Hematologic/Lymphatic: Negative for Hematologic/Lymphatic symptoms  Cardiovascular : Negative for cardiovascular symptoms  Respiratory : Negative for respiratory symptoms  Endocrine: Negative for endocrine symptoms  Musculoskeletal: Negative for musculoskeletal symptoms  Neurological: Negative for neurological symptoms  Psychologic: Negative for psychiatric symptoms  

## 2021-10-14 ENCOUNTER — Encounter: Payer: Self-pay | Admitting: Physician Assistant

## 2021-10-14 ENCOUNTER — Other Ambulatory Visit: Payer: Self-pay

## 2021-10-14 ENCOUNTER — Ambulatory Visit: Payer: BC Managed Care – PPO | Admitting: Physician Assistant

## 2021-10-14 DIAGNOSIS — D692 Other nonthrombocytopenic purpura: Secondary | ICD-10-CM

## 2021-10-14 DIAGNOSIS — L719 Rosacea, unspecified: Secondary | ICD-10-CM

## 2021-10-14 MED ORDER — METRONIDAZOLE 0.75 % EX CREA: TOPICAL_CREAM | Freq: Two times a day (BID) | CUTANEOUS | 6 refills | Status: AC

## 2021-10-14 MED ORDER — MINOCYCLINE HCL 100 MG PO CAPS
100.0000 mg | ORAL_CAPSULE | Freq: Two times a day (BID) | ORAL | 6 refills | Status: DC
Start: 2021-10-14 — End: 2022-06-15

## 2021-10-14 NOTE — Progress Notes (Signed)
° °  Follow-Up Visit   Subjective  Scott Stevenson is a 60 y.o. male who presents for the following: Annual Exam (Patient here for skin check. Has lesions on his face. No history of melanoma, atypical moles, or non moles skin cancers. ).   The following portions of the chart were reviewed this encounter and updated as appropriate:  Tobacco   Allergies   Meds   Problems   Med Hx   Surg Hx   Fam Hx       Objective  Well appearing patient in no apparent distress; mood and affect are within normal limits.  A full examination was performed including scalp, head, eyes, ears, nose, lips, neck, chest, axillae, abdomen, back.bilateral upper extremities,  hands, fingers, and fingernails. All findings within normal limits unless otherwise noted below.  Left Buccal Cheek, Left Malar Cheek, Right Buccal Cheek, Right Malar Cheek Centrifacial erythema with or without papules or pustules.        Right Forearm - Posterior Purple macule   Assessment & Plan  Rosacea Left Malar Cheek; Right Malar Cheek; Left Buccal Cheek; Right Buccal Cheek  minocycline (MINOCIN) 100 MG capsule - Left Buccal Cheek, Left Malar Cheek, Right Buccal Cheek, Right Malar Cheek Take 1 capsule (100 mg total) by mouth 2 (two) times daily.  metroNIDAZOLE (METROCREAM) 0.75 % cream - Left Buccal Cheek, Left Malar Cheek, Right Buccal Cheek, Right Malar Cheek Apply topically 2 (two) times daily.  Purpura (HCC) Right Forearm - Posterior  No good treatment.    I, Gabreille Dardis, PA-C, have reviewed all documentation's for this visit.  The documentation on 10/14/21 for the exam, diagnosis, procedures and orders are all accurate and complete.

## 2021-12-02 ENCOUNTER — Encounter: Payer: Self-pay | Admitting: Family Medicine

## 2021-12-02 ENCOUNTER — Other Ambulatory Visit: Payer: Self-pay

## 2021-12-02 ENCOUNTER — Ambulatory Visit: Payer: BC Managed Care – PPO | Admitting: Family Medicine

## 2021-12-02 VITALS — BP 122/60 | HR 83 | Temp 98.3°F | Ht 73.0 in | Wt 147.0 lb

## 2021-12-02 DIAGNOSIS — R55 Syncope and collapse: Secondary | ICD-10-CM

## 2021-12-02 DIAGNOSIS — E559 Vitamin D deficiency, unspecified: Secondary | ICD-10-CM

## 2021-12-02 DIAGNOSIS — D649 Anemia, unspecified: Secondary | ICD-10-CM | POA: Diagnosis not present

## 2021-12-02 LAB — POCT URINALYSIS DIPSTICK
Bilirubin, UA: NEGATIVE
Blood, UA: NEGATIVE
Glucose, UA: NEGATIVE
Ketones, UA: NEGATIVE
Leukocytes, UA: NEGATIVE
Nitrite, UA: NEGATIVE
Protein, UA: NEGATIVE
Spec Grav, UA: >=1.03 — AB (ref 1.010–1.025)
Urobilinogen, UA: 0.2 E.U./dL
pH, UA: 5.5 (ref 5.0–8.0)

## 2021-12-02 NOTE — Patient Instructions (Signed)
Welcome to [facility] at [facility]! It was a pleasure meeting you today.  As discussed, Please schedule a 1 month follow up visit today.  PLEASE NOTE:  If you had any LAB tests please let us know if you have not heard back within a few days. You may see your results on MyChart before we have a chance to review them but we will give you a call once they are reviewed by us. If we ordered any REFERRALS today, please let us know if you have not heard from their office within the next week.  Let us know through MyChart if you are needing REFILLS, or have your pharmacy send us the request. You can also use MyChart to communicate with me or any office staff.  Please try these tips to maintain a healthy lifestyle:  Eat most of your calories during the day when you are active. Eliminate processed foods including packaged sweets (pies, cakes, cookies), reduce intake of potatoes, white bread, white pasta, and white rice. Look for whole grain options, oat flour or almond flour.  Each meal should contain half fruits/vegetables, one quarter protein, and one quarter carbs (no bigger than a computer mouse).  Cut down on sweet beverages. This includes juice, soda, and sweet tea. Also watch fruit intake, though this is a healthier sweet option, it still contains natural sugar! Limit to 3 servings daily.  Drink at least 1 glass of water with each meal and aim for at least 8 glasses per day  Exercise at least 150 minutes every week.   

## 2021-12-02 NOTE — Progress Notes (Signed)
New Patient Office Visit  Subjective:  Patient ID: Scott Stevenson, male    DOB: 22-Jul-1961  Age: 61 y.o. MRN: [id number]  CC:  Chief Complaint  Patient presents with   Establish Care    Has been a while since having physical    HPI Scott Stevenson presents for new pt  New pt here for cpx-daughter encouraged him to come Going to gym and in steam room.  Had 2 beers, got up to kitchen and got up and fell.  Poss LOC for few seconds.  Thought possible sz and daughter called 911. Declined ER-about 10 days ago. Pt felt light-headed prior and "off balance".  Slight LBP-ibuprofen. [place] Seat and got up and fell again.  Pt not recall second fall.  Not eating well so lost some weight.  No incontinence.  First fall-was dizzy and fell back against counter and dropped plates and broke them. Was confused about who "broke the plates".  Jumped up and then took couple steps and twisted around and fell flat on back and LOC for about 40sec per daughter.  Nothing prior and nothing since.    Retired d/t wife request but wasn't ready.  Now, kind of lost.  No SI. Retired 10/25/21.  Separated in April.   No dark stools-poly[ps on cscope-utd  Past Medical History:  Diagnosis Date   Arthritis     Past Surgical History:  Procedure Laterality Date   TOTAL HIP ARTHROPLASTY Right 06/04/2015   Procedure: RIGHT TOTAL HIP ARTHROPLASTY ANTERIOR APPROACH;  Surgeon: Durene Romans, MD;  Location: [facility];  Service: Orthopedics;  Laterality: Right;   WISDOM TOOTH EXTRACTION      Family History  Problem Relation Age of Onset   Alzheimer's disease Mother    Cancer Mother    Breast cancer Mother    Dementia Father    Colon cancer Sister    Cancer Sister    Alzheimer's disease Sister    Thyroid disease Sister        Not sure of type?   Healthy Brother    Diabetes Neg Hx    Prostate cancer Neg Hx    Heart disease Neg Hx     Social History   Socioeconomic History   Marital status: Married    Spouse name: Not on file    Number of children: 2   Years of education: Not on file   Highest education level: Not on file  Occupational History   Not on file  Tobacco Use   Smoking status: Never   Smokeless tobacco: Never  Vaping Use   Vaping Use: Never used  Substance and Sexual Activity   Alcohol use: Yes    Comment: occasional   Drug use: No   Sexual activity: Not Currently  Other Topics Concern   Not on file  Social History Narrative   Separated-wife going back to [place]      Clinical cytogeneticist w/DA   Social Determinants of Health   Financial Resource Strain: Not on file  Food Insecurity: Not on file  Transportation Needs: Not on file  Physical Activity: Not on file  Stress: Not on file  Social Connections: Not on file  Intimate Partner Violence: Not on file    ROS: negative/noncontributory except as in HPI  Objective:   Today's Vitals: BP 122/60    Pulse 83    Temp 98.3 F (36.8 C) (Temporal)    Ht 6\' 1"  (1.854 m)    Wt 147 lb (66.7 kg)  SpO2 97%    BMI 19.39 kg/m   Gen: WDWN NAD thin wm HEENT: NCAT, conjunctiva not injected, sclera nonicteric TM WNL B, OP dry, no exudates  NECK:  supple, no thyromegaly, no nodes, no carotid bruits CARDIAC: RRR w/variability, S1S2+, no murmur. DP 2+B LUNGS: CTAB. No wheezes ABDOMEN:  BS+, soft, NTND, No HSM, no masses EXT:  no edema MSK: no gross abnormalities.  NEURO: A&O x3.  CN II-XII intact. F-N,RAM,pronator drift neg PSYCH: normal mood. Good eye contact   Reviewed records/labs-5-36min  EKG-some poss LAE, sinus variability, inv T V1-2  Results for orders placed or performed in visit on 12/02/21  POCT urinalysis dipstick  Result Value Ref Range   Color, UA yellow    Clarity, UA clear    Glucose, UA Negative Negative   Bilirubin, UA neg    Ketones, UA neg    Spec Grav, UA >=1.030 (A) 1.010 - 1.025   Blood, UA neg    pH, UA 5.5 5.0 - 8.0   Protein, UA Negative Negative   Urobilinogen, UA 0.2 0.2 or 1.0 E.U./dL   Nitrite, UA neg     Leukocytes, UA Negative Negative   Appearance     Odor      Assessment & Plan:   Problem List Items Addressed This Visit   None Visit Diagnoses     Syncope, unspecified syncope type    -  Primary   Relevant Orders   Comprehensive metabolic panel   Hemoglobin A1c   TSH   CBC with Differential/Platelet   Vitamin B12   VITAMIN D 25 Hydroxy (Vit-D Deficiency, Fractures)   IBC panel   POCT urinalysis dipstick (Completed)   EKG 12-Lead (Completed)   ECHOCARDIOGRAM COMPLETE   Anemia, unspecified type       Relevant Orders   CBC with Differential/Platelet   Vitamin B12   IBC panel   Vitamin D deficiency       Relevant Orders   VITAMIN D 25 Hydroxy (Vit-D Deficiency, Fractures)      Syncope-?dehydration, stress, cardiac, other.  Pt minimizing it.  EKG sl abn.  Check labs, check echo.  Increase fluids. Anemia-from labs in 2021-pt not sure why.  Cscope utd but does get q54yrs d/t polyps.  Check labs. Vit D deficiency-from old labs-reck.   Outpatient Encounter Medications as of 12/02/2021  Medication Sig   Ascorbic Acid (VITAMIN C PO) Take by mouth daily.   metroNIDAZOLE (METROCREAM) 0.75 % cream Apply topically 2 (two) times daily.   minocycline (MINOCIN) 100 MG capsule Take 1 capsule (100 mg total) by mouth 2 (two) times daily.   Nutritional Supplements (JUICE PLUS FIBRE PO) Take 2 tablets by mouth every morning. Fruit   Omega-3 Fatty Acids (FISH OIL PO) Take by mouth daily.   [DISCONTINUED] HYDROcodone-acetaminophen (NORCO/VICODIN) 5-325 MG tablet Take 1 tablet by mouth every 6 (six) hours as needed for moderate pain. (Patient not taking: Reported on 10/14/2021)   [DISCONTINUED] sildenafil (REVATIO) 20 MG tablet Take 1 tablet (20 mg total) by mouth daily. Take 1-5 tablets as needed (Patient not taking: Reported on 10/14/2021)   [DISCONTINUED] sulfamethoxazole-trimethoprim (BACTRIM DS) 800-160 MG tablet Take 1 tablet by mouth 2 (two) times daily. (Patient not taking: Reported on  10/14/2021)   [DISCONTINUED] tamsulosin (FLOMAX) 0.4 MG CAPS capsule Take 1 capsule (0.4 mg total) by mouth daily. (Patient not taking: Reported on 10/14/2021)   No facility-administered encounter medications on file as of 12/02/2021.    Follow-up: Return in about 4  weeks (around 12/30/2021) for physical.   Angelena Sole, MD

## 2021-12-03 LAB — CBC WITH DIFFERENTIAL/PLATELET
Basophils Absolute: 0.1 10*3/uL (ref 0.0–0.1)
Basophils Relative: 1.8 % (ref 0.0–3.0)
Eosinophils Absolute: 0.1 10*3/uL (ref 0.0–0.7)
Eosinophils Relative: 1.8 % (ref 0.0–5.0)
HCT: 39.3 % (ref 39.0–52.0)
Hemoglobin: 13 g/dL (ref 13.0–17.0)
Lymphocytes Relative: 17.6 % (ref 12.0–46.0)
Lymphs Abs: 1 10*3/uL (ref 0.7–4.0)
MCHC: 33.1 g/dL (ref 30.0–36.0)
MCV: 100.2 fl — ABNORMAL HIGH (ref 78.0–100.0)
Monocytes Absolute: 0.5 10*3/uL (ref 0.1–1.0)
Monocytes Relative: 10.1 % (ref 3.0–12.0)
Neutro Abs: 3.8 10*3/uL (ref 1.4–7.7)
Neutrophils Relative %: 68.7 % (ref 43.0–77.0)
Platelets: 301 10*3/uL (ref 150.0–400.0)
RBC: 3.93 Mil/uL — ABNORMAL LOW (ref 4.22–5.81)
RDW: 13.6 % (ref 11.5–15.5)
WBC: 5.5 10*3/uL (ref 4.0–10.5)

## 2021-12-03 LAB — VITAMIN D 25 HYDROXY (VIT D DEFICIENCY, FRACTURES): VITD: 31.94 ng/mL (ref 30.00–100.00)

## 2021-12-03 LAB — COMPREHENSIVE METABOLIC PANEL
ALT: 11 U/L (ref 0–53)
AST: 20 U/L (ref 0–37)
Albumin: 3.8 g/dL (ref 3.5–5.2)
Alkaline Phosphatase: 126 U/L — ABNORMAL HIGH (ref 39–117)
BUN: 18 mg/dL (ref 6–23)
CO2: 28 mEq/L (ref 19–32)
Calcium: 9.3 mg/dL (ref 8.4–10.5)
Chloride: 104 mEq/L (ref 96–112)
Creatinine, Ser: 1.12 mg/dL (ref 0.40–1.50)
GFR: 70.99 mL/min (ref >60.00)
Glucose, Bld: 92 mg/dL (ref 70–99)
Potassium: 4.8 mEq/L (ref 3.5–5.1)
Sodium: 142 mEq/L (ref 135–145)
Total Bilirubin: 0.7 mg/dL (ref 0.2–1.2)
Total Protein: 6.8 g/dL (ref 6.0–8.3)

## 2021-12-03 LAB — HEMOGLOBIN A1C: Hgb A1c MFr Bld: 6.3 % (ref 4.6–6.5)

## 2021-12-03 LAB — VITAMIN B12: Vitamin B-12: 944 pg/mL — ABNORMAL HIGH (ref 211–911)

## 2021-12-03 LAB — IBC PANEL
Iron: 115 ug/dL (ref 42–165)
Saturation Ratios: 43.9 % (ref 20.0–50.0)
TIBC: 261.8 ug/dL (ref 250.0–450.0)
Transferrin: 187 mg/dL — ABNORMAL LOW (ref 212.0–360.0)

## 2021-12-03 LAB — TSH: TSH: 1.88 u[IU]/mL (ref 0.35–5.50)

## 2021-12-24 ENCOUNTER — Telehealth (HOSPITAL_BASED_OUTPATIENT_CLINIC_OR_DEPARTMENT_OTHER): Payer: Self-pay | Admitting: *Deleted

## 2021-12-24 NOTE — Telephone Encounter (Signed)
Left message for patient to call and schedule Echo ordered by Dr. Kulik ?

## 2021-12-30 ENCOUNTER — Encounter: Payer: BC Managed Care – PPO | Admitting: Family Medicine

## 2022-01-06 ENCOUNTER — Ambulatory Visit: Payer: BC Managed Care – PPO | Admitting: Family Medicine

## 2022-01-06 ENCOUNTER — Encounter: Payer: Self-pay | Admitting: Family Medicine

## 2022-01-06 ENCOUNTER — Ambulatory Visit (INDEPENDENT_AMBULATORY_CARE_PROVIDER_SITE_OTHER): Payer: BC Managed Care – PPO | Admitting: Family Medicine

## 2022-01-06 VITALS — BP 120/62 | HR 56 | Temp 98.6°F | Ht 73.0 in | Wt 157.1 lb

## 2022-01-06 DIAGNOSIS — Z0000 Encounter for general adult medical examination without abnormal findings: Secondary | ICD-10-CM

## 2022-01-06 DIAGNOSIS — Z114 Encounter for screening for human immunodeficiency virus [HIV]: Secondary | ICD-10-CM

## 2022-01-06 DIAGNOSIS — Z23 Encounter for immunization: Secondary | ICD-10-CM

## 2022-01-06 DIAGNOSIS — Z1159 Encounter for screening for other viral diseases: Secondary | ICD-10-CM

## 2022-01-06 DIAGNOSIS — R748 Abnormal levels of other serum enzymes: Secondary | ICD-10-CM

## 2022-01-06 NOTE — Patient Instructions (Signed)
It was very nice to see you today! ? ?Drink 64 oz water/daily and use the electrolytes.  ? ? ?PLEASE NOTE: ? ?If you had any lab tests please let us know if you have not heard back within a few days. You may see your results on MyChart before we have a chance to review them but we will give you a call once they are reviewed by [place]. If we ordered any referrals today, please let us know if you have not heard from their office within the next week.  ? ?Please try these tips to maintain a healthy lifestyle: ? ?Eat most of your calories during the day when you are active. Eliminate processed foods including packaged sweets (pies, cakes, cookies), reduce intake of potatoes, white bread, white pasta, and white rice. Look for whole grain options, oat flour or almond flour. ? ?Each meal should contain half fruits/vegetables, one quarter protein, and one quarter carbs (no bigger than a computer mouse). ? ?Cut down on sweet beverages. This includes juice, soda, and sweet tea. Also watch fruit intake, though this is a healthier sweet option, it still contains natural sugar! Limit to 3 servings daily. ? ?Drink at least 1 glass of water with each meal and aim for at least 8 glasses per day ? ?Exercise at least 150 minutes every week.   ?

## 2022-01-06 NOTE — Progress Notes (Signed)
?Phone: [phone] ?  ?Subjective:  ?Patient 61 y.o. male presenting for annual physical. ?Echo sch tomorrow. ?Since Fell-freq, when gets up, some "off balance" sensation.  Resolves rapidly. ? ?Exercises daily. Spin bike 31mn, push up, sit up. Not drinking enough water. ?2-3 beers/day. ?Daughter fitness expert so monitors him ? ?Did get first shingrix ? ?Cscope utd-has polyps so on recall.  Thinks did 2021. ? ?Chief Complaint  ?Patient presents with  ? Annual Exam  ?  Not fasting ?  ? ? ?See problem oriented charting-sch for tomorrow ?ROS-  ?ROS: ?Gen: no fever, chills  ?Skin: no rash, itching ?ENT: no ear pain, ear drainage, nasal congestion, rhinorrhea, sinus pressure, sore throat ?Eyes: no blurry vision, double vision ?Resp: no cough, wheeze,SOB ?Breast: no breast tenderness, no nipple discharge, no breast masses ?CV: no CP, palpitations, LE edema,  ?GI: no heartburn, n/v/d/c, abd pain ?GU: no dysuria, urgency, frequency, hematuria ?MSK: no joint pain, myalgias, back pain ?Neuro: no dizziness, headache, weakness, vertigo.  H/o migraine but not for years. ?Psych: no depression, anxiety, insomnia, SI.  Doing ok w/wife situation ? ?The following were reviewed and entered/updated in epic: ?Past Medical History:  ?Diagnosis Date  ? Arthritis   ? ?Patient Active Problem List  ? Diagnosis Date Noted  ? History of colon polyps 04/18/2020  ? Family history of Alzheimer's disease 04/18/2020  ? 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 ?Past Surgical History:  ?Procedure Laterality Date  ? TOTAL HIP ARTHROPLASTY Right 06/04/2015  ? Procedure: RIGHT TOTAL HIP ARTHROPLASTY ANTERIOR APPROACH;  Surgeon: MParalee Cancel MD;  Location: [facility];  Service: Orthopedics;  Laterality: Right;  ? WISDOM TOOTH EXTRACTION    ? ? ?Family History  ?Problem Relation Age of Onset  ? Alzheimer's disease Mother   ? Cancer Mother   ? Breast cancer Mother   ? Dementia Father   ? Colon cancer Sister   ? Cancer Sister   ? Alzheimer's disease Sister   ?  Thyroid disease Sister   ?     Not sure of type?  ? Healthy Brother   ? Diabetes Neg Hx   ? Prostate cancer Neg Hx   ? Heart disease Neg Hx   ? ? ?Medications- reviewed and updated ?Current Outpatient Medications  ?Medication Sig Dispense Refill  ? Ascorbic Acid (VITAMIN C PO) Take by mouth daily.    ? metroNIDAZOLE (METROCREAM) 0.75 % cream Apply topically 2 (two) times daily. 45 g 6  ? minocycline (MINOCIN) 100 MG capsule Take 1 capsule (100 mg total) by mouth 2 (two) times daily. 60 capsule 6  ? Nutritional Supplements (JUICE PLUS FIBRE PO) Take 2 tablets by mouth every morning. Fruit    ? Omega-3 Fatty Acids (FISH OIL PO) Take by mouth daily.    ? ?No current facility-administered medications for this visit.  ? ? ?Allergies-reviewed and updated ?No Known Allergies ? ?Social History  ? ?Social History Narrative  ? Separ[place] ?   ? Retired-chief assistant w/DA  ? ?Objective  ?Objective:  ?BP 120/62   Pulse (!) 56   Temp 98.6 ?F (37 ?C) (Temporal)   Ht 6' 1" (1.854 m)   Wt 157 lb 2 oz (71.3 kg)   SpO2 98%   BMI 20.73 kg/m?  ? ?Physical Exam  ?Gen: WDWN NAD ?HEENT: NCAT, conjunctiva not injected, sclera nonicteric ?TM WNL B, OP moist, no exudates  ?NECK:  supple, no thyromegaly, no nodes, no carotid bruits ?CARDIAC: RRR, S1S2+,  no murmur. DP 2+B ?LUNGS: CTAB. No wheezes ?ABDOMEN:  BS+, soft, NTND, No HSM, no masses ?EXT:  no edema ?MSK: no gross abnormalities.  ?NEURO: A&O x3.  CN II-XII intact.  ?PSYCH: normal mood. Good eye contact  ?  ?Assessment and Plan  ? ?Health Maintenance counseling: ?1. Anticipatory guidance: Patient counseled regarding regular dental exams q6 months, eye exams yearly, avoiding smoking and second hand smoke, limiting alcohol to 2 beverages per day.   ?2. Risk factor reduction:  Advised patient of need for regular exercise and diet rich in fruits and vegetables to reduce risk of heart attack and stroke. Exercise- does.   ?Wt Readings from Last 3 Encounters:   ?01/06/22 157 lb 2 oz (71.3 kg)  ?12/02/21 147 lb (66.7 kg)  ?08/11/21 160 lb 3.2 oz (72.7 kg)  ? ?3. Immunizations/screenings/ancillary studies ?Immunization History  ?Administered Date(s) Administered  ? Influenza Split 08/12/2018  ? PFIZER(Purple Top)SARS-COV-2 Vaccination 12/15/2019, 01/09/2020, 08/27/2020  ? Zoster Recombinat (Shingrix) 10/26/2020  ? ?Health Maintenance Due  ?Topic Date Due  ? Hepatitis C Screening  Never done  ? TETANUS/TDAP  Never done  ? COLONOSCOPY (Pts 45-62yr Insurance coverage will need to be confirmed)  Never done  ? Zoster Vaccines- Shingrix (2 of 2) 12/21/2020  ?  ?4. Prostate cancer screening >55yo - risk factors?  ?Lab Results  ?Component Value Date  ? PSA 0.5 04/18/2020  ?  ?5. Colon cancer screening:UTD ?6. Skin cancer screening- Iadvised regular sunscreen use. Denies worrisome, changing, or new skin lesions.  ?7. Smoking associated screening (lung cancer screening, AAA screen 65-75, UA)- non smoker- ppd ?8. STD screening - n/a ? ?Problem List Items Addressed This Visit   ?None ?Visit Diagnoses   ? ? Wellness examination    -  Primary  ? Screening for HIV without presence of risk factors      ? Relevant Orders  ? HIV Antibody (routine testing w rflx)  ? Encounter for hepatitis C screening test for low risk patient      ? Relevant Orders  ? Hepatitis C antibody  ? Elevated alkaline phosphatase level      ? Relevant Orders  ? Comprehensive metabolic panel  ? ?  ? ? ?Recommended follow up: 12 monReturn for annual in 1 yr.  labs in 3 months. ?Future Appointments  ?Date Time Provider DStillmore ?01/07/2022  3:00 PM [facility]-ECHO/VAS [facility]  ?10/14/2022  8:30 AM [place], KRonalee Red PA-C [facility]  ? ? ? ?Lab/Order associations:n/a fasting ?  ICD-10-CM   ?1. Wellness examination  Z00.00   ?  ?2. Screening for HIV without presence of risk factors  Z11.4 HIV Antibody (routine testing w rflx)  ?  ?3. Encounter for hepatitis C screening test for low risk patient  Z11.59  Hepatitis C antibody  ?  ?4. Elevated alkaline phosphatase level  R74.8 Comprehensive metabolic panel  ?  ? Wellness-antic guidance.  Updated imm.   ?Elevated alk phos-dec ETOH.  Reck 3 mo ? ?No orders of the defined types were placed in this encounter. ? ?  ?AWellington Hampshire MD ? ? ?

## 2022-01-07 ENCOUNTER — Ambulatory Visit (INDEPENDENT_AMBULATORY_CARE_PROVIDER_SITE_OTHER): Payer: BC Managed Care – PPO

## 2022-01-07 ENCOUNTER — Other Ambulatory Visit: Payer: Self-pay

## 2022-01-07 DIAGNOSIS — R55 Syncope and collapse: Secondary | ICD-10-CM | POA: Diagnosis not present

## 2022-01-07 LAB — ECHOCARDIOGRAM COMPLETE
AR max vel: 2.39 cm2
AV Area VTI: 2.42 cm2
AV Area mean vel: 2.41 cm2
AV Mean grad: 3 mmHg
AV Peak grad: 6.8 mmHg
Ao pk vel: 1.3 m/s
Area-P 1/2: 3.37 cm2
Calc EF: 52.7 %
S' Lateral: 3.39 cm
Single Plane A2C EF: 54.8 %
Single Plane A4C EF: 51.1 %

## 2022-01-15 ENCOUNTER — Telehealth: Payer: Self-pay | Admitting: *Deleted

## 2022-01-15 NOTE — Telephone Encounter (Signed)
Patient called and stated that he missed a call from PCP stating she wanted to discuss echo results with him. Read echo report to him from PCP, patient verbalized understanding. Patient stated that he had read that message on mychart and was wondering if it was more she wanted to discuss since she left him a voicemail. Please call patient back. ?

## 2022-01-15 NOTE — Telephone Encounter (Signed)
Patient notified and verbalized understanding. 

## 2022-04-08 ENCOUNTER — Other Ambulatory Visit (INDEPENDENT_AMBULATORY_CARE_PROVIDER_SITE_OTHER): Payer: BC Managed Care – PPO

## 2022-04-08 DIAGNOSIS — R748 Abnormal levels of other serum enzymes: Secondary | ICD-10-CM | POA: Diagnosis not present

## 2022-04-08 DIAGNOSIS — Z1159 Encounter for screening for other viral diseases: Secondary | ICD-10-CM

## 2022-04-08 DIAGNOSIS — Z114 Encounter for screening for human immunodeficiency virus [HIV]: Secondary | ICD-10-CM

## 2022-04-08 LAB — COMPREHENSIVE METABOLIC PANEL
ALT: 12 U/L (ref 0–53)
AST: 25 U/L (ref 0–37)
Albumin: 3.8 g/dL (ref 3.5–5.2)
Alkaline Phosphatase: 59 U/L (ref 39–117)
BUN: 23 mg/dL (ref 6–23)
CO2: 27 mEq/L (ref 19–32)
Calcium: 9 mg/dL (ref 8.4–10.5)
Chloride: 103 mEq/L (ref 96–112)
Creatinine, Ser: 0.99 mg/dL (ref 0.40–1.50)
GFR: 82.11 mL/min (ref >60.00)
Glucose, Bld: 77 mg/dL (ref 70–99)
Potassium: 4.2 mEq/L (ref 3.5–5.1)
Sodium: 136 mEq/L (ref 135–145)
Total Bilirubin: 0.6 mg/dL (ref 0.2–1.2)
Total Protein: 6.3 g/dL (ref 6.0–8.3)

## 2022-04-09 LAB — HIV ANTIBODY (ROUTINE TESTING W REFLEX): HIV 1&2 Ab, 4th Generation: NONREACTIVE

## 2022-04-09 LAB — HEPATITIS C ANTIBODY
Hepatitis C Ab: NONREACTIVE
SIGNAL TO CUT-OFF: 0.13 (ref <1.00)

## 2022-06-15 ENCOUNTER — Other Ambulatory Visit: Payer: Self-pay | Admitting: Physician Assistant

## 2022-06-15 DIAGNOSIS — L719 Rosacea, unspecified: Secondary | ICD-10-CM

## 2022-07-20 ENCOUNTER — Encounter: Payer: Self-pay | Admitting: *Deleted

## 2022-10-08 ENCOUNTER — Encounter: Payer: Self-pay | Admitting: *Deleted

## 2022-10-14 ENCOUNTER — Ambulatory Visit: Payer: BC Managed Care – PPO | Admitting: Physician Assistant

## 2023-05-10 ENCOUNTER — Ambulatory Visit: Payer: BC Managed Care – PPO | Admitting: Dermatology

## 2023-05-10 ENCOUNTER — Encounter: Payer: Self-pay | Admitting: Dermatology

## 2023-05-10 VITALS — BP 155/99 | HR 58

## 2023-05-10 DIAGNOSIS — L719 Rosacea, unspecified: Secondary | ICD-10-CM | POA: Diagnosis not present

## 2023-05-10 DIAGNOSIS — L570 Actinic keratosis: Secondary | ICD-10-CM | POA: Diagnosis not present

## 2023-05-10 MED ORDER — MINOCYCLINE HCL 100 MG PO CAPS: 100.0000 mg | ORAL_CAPSULE | Freq: Every day | ORAL | 6 refills | Status: DC

## 2023-05-10 NOTE — Patient Instructions (Addendum)
Thank you for visiting our clinic today. I appreciate your commitment to managing your health and am glad we could discuss your ongoing treatment for rosacea and address other skin concerns.  Here is a summary of the key points from our consultation:  - Minocycline Prescription: Continue taking Minocycline 100 mg, one tablet daily. I have provided a prescription with six refills.  - Actinic Keratosis Treatment: We treated the rough patch on your skin, identified as actinic keratosis, with liquid nitrogen today. Please apply Vaseline generously in the morning and evening after washing your face to aid healing.  - Sunscreen Use: It is crucial to protect your skin from the sun to manage your rosacea and prevent further skin damage. I have given you samples of different sunscreens to try and find one that suits you best.  - Follow-Up Appointments: Plan to return for a full skin cancer screening in the fall. Regular check-ups every six months are recommended to monitor your condition and adjust treatment as necessary.  Please ensure to follow the aftercare instructions for the treated skin area and do not hesitate to contact us if you have any questions or concerns about your treatment regimen.        Cryotherapy Aftercare  Wash gently with soap and water everyday.   Apply Vaseline and Band-Aid daily until healed.    Due to recent changes in healthcare laws, you may see results of your pathology and/or laboratory studies on MyChart before the doctors have had a chance to review them. We understand that in some cases there may be results that are confusing or concerning to you. Please understand that not all results are received at the same time and often the doctors may need to interpret multiple results in order to provide you with the best plan of care or course of treatment. Therefore, we ask that you please give [place] 2 business days to thoroughly review all your results before contacting the  office for clarification. Should we see a critical lab result, you will be contacted sooner.   If You Need Anything After Your Visit  If you have any questions or concerns for your doctor, please call our main line at [phone] If no one answers, please leave a voicemail as directed and we will return your call as soon as possible. Messages left after 4 pm will be answered the following business day.   You may also send [place] a message via MyChart. We typically respond to MyChart messages within 1-2 business days.  For prescription refills, please ask your pharmacy to contact our office. Our fax number is [phone].  If you have an urgent issue when the clinic is closed that cannot wait until the next business day, you can page your doctor at the number below.    Please note that while we do our best to be available for urgent issues outside of office hours, we are not available 24/7.   If you have an urgent issue and are unable to reach [place], you may choose to seek medical care at your doctor's office, retail clinic, urgent care center, or emergency room.  If you have a medical emergency, please immediately call 911 or go to the emergency department. In the event of inclement weather, please call our main line at [phone] for an update on the status of any delays or closures.  Dermatology Medication Tips: Please keep the boxes that topical medications come in in order to help keep track of the instructions about  where and how to use these. Pharmacies typically print the medication instructions only on the boxes and not directly on the medication tubes.   If your medication is too expensive, please contact our office at [phone] or send [place] a message through MyChart.   We are unable to tell what your co-pay for medications will be in advance as this is different depending on your insurance coverage. However, we may be able to find a substitute medication at lower cost or fill out  paperwork to get insurance to cover a needed medication.   If a prior authorization is required to get your medication covered by your insurance company, please allow [place] 1-2 business days to complete this process.  Drug prices often vary depending on where the prescription is filled and some pharmacies may offer cheaper prices.  The website www.goodrx.com contains coupons for medications through different pharmacies. The prices here do not account for what the cost may be with help from insurance (it may be cheaper with your insurance), but the website can give you the price if you did not use any insurance.  - You can print the associated coupon and take it with your prescription to the pharmacy.  - You may also stop by our office during regular business hours and pick up a GoodRx coupon card.  - If you need your prescription sent electronically to a different pharmacy, notify our office through [facility] or by phone at [phone]

## 2023-05-10 NOTE — Progress Notes (Signed)
   New Patient Visit   Subjective  Scott Stevenson is a 62 y.o. male who presents for the following: Rosacea  Patient states he has rosacea located at the face that he  would like to have examined. Patient reports the areas have been there for  1  year(s). He  reports the areas are not bothersome. He states that the areas have not spread. Patient reports he has previously been treated for these areas by [facility].Patient states he is unsure of what his current triggers may be. He reports his last does of Minocycline tablets has been a month ago. Patient denies Hx of bx. Patient denies family history of skin cancer(s). Patient reports he has had moderate sun exposure throughout his lifetime.Currently, Patient reports if he does have excessive sun exposure, he does apply sunscreen.  The following portions of the chart were reviewed this encounter and updated as appropriate: medications, allergies, medical history  Review of Systems:  No other skin or systemic complaints except as noted in HPI or Assessment and Plan.  Objective  Well appearing patient in no apparent distress; mood and affect are within normal limits.  A focused examination was performed of the following areas: Face  Relevant exam findings are noted in the Assessment and Plan.  Right Malar Cheek Erythematous thin papules/macules with gritty scale.          Assessment & Plan   ROSACEA  Exam: Mid face erythema with telangiectasias +/- scattered inflammatory papules   Rosacea is a chronic progressive skin condition usually affecting the face of adults, causing redness and/or acne bumps. It is treatable but not curable. It sometimes affects the eyes (ocular rosacea) as well. It may respond to topical and/or systemic medication and can flare with stress, sun exposure, alcohol, exercise, topical steroids (including hydrocortisone/cortisone 10) and some foods.  Daily application of broad spectrum spf 30+ sunscreen to  face is recommended to reduce flares.  Patient denies grittiness of the eyes  Treatment Plan: - We Will send in refills for Minocycline - We will plan to f/u in 6 months to follow up  ACTINIC DAMAGE WITH PRECANCEROUS   Exam: diffuse scaly erythematous macules and papules with underlying dyspigmentation  AK (actinic keratosis) Right Malar Cheek  Destruction of lesion - Right Malar Cheek Complexity: simple   Destruction method: cryotherapy   Informed consent: discussed and consent obtained   Timeout:  patient name, date of birth, surgical site, and procedure verified Lesion destroyed using liquid nitrogen: Yes   Cryotherapy cycles:  1 Outcome: patient tolerated procedure well with no complications   Post-procedure details: wound care instructions given    Rosacea  Related Medications minocycline (MINOCIN) 100 MG capsule Take 1 capsule (100 mg total) by mouth daily.   - Condition that is chronic, not at goal. - Recommend daily broad spectrum sunscreen SPF 30+ to sun-exposed areas, reapply every 2 hours as needed.  - Staying in the shade or wearing long sleeves, sun glasses (UVA+UVB protection) and wide brim hats (4-inch brim around the entire circumference of the hat) are also recommended. - Call for new or changing lesions.   Return in 6 months (on 11/10/2023) for Rosacea, TBSE F/U.  Documentation: I have reviewed the above documentation for accuracy and completeness, and I agree with the above.  Stasia Cavalier, am acting as scribe for Langston Reusing, DO.   Langston Reusing, DO

## 2023-08-09 ENCOUNTER — Ambulatory Visit: Payer: BC Managed Care – PPO | Admitting: Family Medicine

## 2023-08-09 ENCOUNTER — Encounter: Payer: Self-pay | Admitting: Family Medicine

## 2023-08-09 VITALS — BP 123/78 | HR 54 | Temp 97.9°F | Resp 18 | Ht 73.0 in | Wt 165.2 lb

## 2023-08-09 DIAGNOSIS — Z125 Encounter for screening for malignant neoplasm of prostate: Secondary | ICD-10-CM

## 2023-08-09 DIAGNOSIS — Z13220 Encounter for screening for lipoid disorders: Secondary | ICD-10-CM | POA: Diagnosis not present

## 2023-08-09 DIAGNOSIS — R7303 Prediabetes: Secondary | ICD-10-CM | POA: Insufficient documentation

## 2023-08-09 DIAGNOSIS — Z0000 Encounter for general adult medical examination without abnormal findings: Secondary | ICD-10-CM

## 2023-08-09 DIAGNOSIS — Z23 Encounter for immunization: Secondary | ICD-10-CM

## 2023-08-09 LAB — CBC WITH DIFFERENTIAL/PLATELET
Basophils Absolute: 0 10*3/uL (ref 0.0–0.1)
Basophils Relative: 0.7 % (ref 0.0–3.0)
Eosinophils Absolute: 0.1 10*3/uL (ref 0.0–0.7)
Eosinophils Relative: 1 % (ref 0.0–5.0)
HCT: 40.5 % (ref 39.0–52.0)
Hemoglobin: 13.2 g/dL (ref 13.0–17.0)
Lymphocytes Relative: 11.3 % — ABNORMAL LOW (ref 12.0–46.0)
Lymphs Abs: 0.7 10*3/uL (ref 0.7–4.0)
MCHC: 32.7 g/dL (ref 30.0–36.0)
MCV: 100.2 fL — ABNORMAL HIGH (ref 78.0–100.0)
Monocytes Absolute: 0.3 10*3/uL (ref 0.1–1.0)
Monocytes Relative: 5.7 % (ref 3.0–12.0)
Neutro Abs: 4.7 10*3/uL (ref 1.4–7.7)
Neutrophils Relative %: 81.3 % — ABNORMAL HIGH (ref 43.0–77.0)
Platelets: 228 10*3/uL (ref 150.0–400.0)
RBC: 4.04 Mil/uL — ABNORMAL LOW (ref 4.22–5.81)
RDW: 13.8 % (ref 11.5–15.5)
WBC: 5.8 10*3/uL (ref 4.0–10.5)

## 2023-08-09 LAB — LIPID PANEL
Cholesterol: 210 mg/dL — ABNORMAL HIGH (ref 0–200)
HDL: 65.7 mg/dL (ref >39.00)
LDL Cholesterol: 130 mg/dL — ABNORMAL HIGH (ref 0–99)
NonHDL: 144.05
Total CHOL/HDL Ratio: 3
Triglycerides: 70 mg/dL (ref 0.0–149.0)
VLDL: 14 mg/dL (ref 0.0–40.0)

## 2023-08-09 LAB — COMPREHENSIVE METABOLIC PANEL
ALT: 9 U/L (ref 0–53)
AST: 22 U/L (ref 0–37)
Albumin: 4.2 g/dL (ref 3.5–5.2)
Alkaline Phosphatase: 62 U/L (ref 39–117)
BUN: 16 mg/dL (ref 6–23)
CO2: 29 meq/L (ref 19–32)
Calcium: 9.4 mg/dL (ref 8.4–10.5)
Chloride: 101 meq/L (ref 96–112)
Creatinine, Ser: 0.95 mg/dL (ref 0.40–1.50)
GFR: 85.47 mL/min (ref >60.00)
Glucose, Bld: 109 mg/dL — ABNORMAL HIGH (ref 70–99)
Potassium: 4.5 meq/L (ref 3.5–5.1)
Sodium: 138 meq/L (ref 135–145)
Total Bilirubin: 0.6 mg/dL (ref 0.2–1.2)
Total Protein: 6.6 g/dL (ref 6.0–8.3)

## 2023-08-09 LAB — HEMOGLOBIN A1C: Hgb A1c MFr Bld: 5.7 % (ref 4.6–6.5)

## 2023-08-09 LAB — PSA: PSA: 0.88 ng/mL (ref 0.10–4.00)

## 2023-08-09 LAB — TSH: TSH: 1.67 u[IU]/mL (ref 0.35–5.50)

## 2023-08-09 NOTE — Patient Instructions (Signed)

## 2023-08-09 NOTE — Progress Notes (Signed)
Labs look pretty good. 1.  Sugars have improved from last year, however they are still impaired.  Continue working on diet/exercise 2.  Cholesterol has increased some from the past.  Good cholesterol is protective.  Continue exercise, work a little harder on diet

## 2023-08-09 NOTE — Progress Notes (Signed)
Phone: [phone]   Subjective:  Patient 62 y.o. male presenting for annual physical.  Chief Complaint  Patient presents with   Annual Exam    CPE Had coffee and a power bar    Annual - Reports he is exercising regularly. States he is now working part time, up to 29 hours per week, along with volunteering.   Joint Pain - Endorses difficulty in bending his fingers easily. Denies swelling, states his fingers feel stiff.   See problem oriented charting- ROS- ROS: Gen: no fever, chills  Skin: no rash, itching ENT: no ear pain, ear drainage, nasal congestion, rhinorrhea, sinus pressure, sore throat Eyes: no blurry vision, double vision Resp: no cough, wheeze,SOB CV: no CP, palpitations, LE edema,  GI: no heartburn, n/v/d/c, abd pain GU: no dysuria, urgency, frequency, hematuria MSK: no joint pain, myalgias, back pain Neuro: no dizziness, headache, weakness, vertigo Psych: no depression, anxiety, insomnia, SI   The following were reviewed and entered/updated in epic: Past Medical History:  Diagnosis Date   Arthritis    Patient Active Problem List   Diagnosis Date Noted   Prediabetes 08/09/2023   History of colon polyps 04/18/2020   Family history of Alzheimer's disease 04/18/2020   S/P right THA, 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   TOTAL HIP ARTHROPLASTY Right 06/04/2015   Procedure: RIGHT TOTAL HIP ARTHROPLASTY ANTERIOR APPROACH;  Surgeon: Durene Romans, MD;  Location: [facility];  Service: Orthopedics;  Laterality: Right;   WISDOM TOOTH EXTRACTION      Family History  Problem Relation Age of Onset   Alzheimer's disease Mother    Cancer Mother    Breast cancer Mother    Dementia Father    Colon cancer Sister    Cancer Sister    Alzheimer's disease Sister    Thyroid disease Sister        Not sure of type?   Healthy Brother    ADD / ADHD Daughter    ADD / ADHD Son    Diabetes Neg Hx     Prostate cancer Neg Hx    Heart disease Neg Hx     Medications- reviewed and updated Current Outpatient Medications  Medication Sig Dispense Refill   Ascorbic Acid (VITAMIN C PO) Take by mouth daily.     minocycline (MINOCIN) 100 MG capsule Take 1 capsule (100 mg total) by mouth daily. 30 capsule 6   Omega-3 Fatty Acids (FISH OIL PO) Take by mouth daily.     No current facility-administered medications for this visit.    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   Objective  Objective:  BP 123/78   Pulse (!) 54   Temp 97.9 F (36.6 C) (Temporal)   Resp 18   Ht 6\' 1"  (1.854 m)   Wt 165 lb 4 oz (75 kg)   SpO2 100%   BMI 21.80 kg/m  Physical Exam  Gen: WDWN NAD HEENT: NCAT, conjunctiva not injected, sclera nonicteric TM WNL B, OP moist, no exudates  NECK:  supple, no thyromegaly, no nodes, no carotid bruits CARDIAC: brady RRR, S1S2+, no murmur. DP 2+B LUNGS: CTAB. No wheezes ABDOMEN:  BS+, soft, NTND, No HSM, no masses EXT:  no edema MSK: no gross abnormalities. MS 5/5 all 4 NEURO: A&O x3.  CN II-XII intact.  PSYCH:  normal mood. Good eye contact     Assessment and Plan   Health Maintenance counseling: 1. Anticipatory guidance: Patient counseled regarding regular dental exams q6 months, eye exams yearly, avoiding smoking and second hand smoke, limiting alcohol to 2 beverages per day.   2. Risk factor reduction:  Advised patient of need for regular exercise and diet rich in fruits and vegetables to reduce risk of heart attack and stroke.  Exercise- States he is exercising regularly. .   Wt Readings from Last 3 Encounters:  08/09/23 165 lb 4 oz (75 kg)  01/06/22 157 lb 2 oz (71.3 kg)  12/02/21 147 lb (66.7 kg)   3. Immunizations/screenings/ancillary studies Immunization History  Administered Date(s) Administered   Influenza Split 08/12/2018    Influenza, Seasonal, Injecte, Preservative Fre 08/09/2023   PFIZER(Purple Top)SARS-COV-2 Vaccination 12/15/2019, 01/09/2020, 08/27/2020   Tdap 01/06/2022   Zoster Recombinant(Shingrix) 10/26/2020, 01/06/2022   There are no preventive care reminders to display for this patient.   4. Prostate cancer screening >55yo - risk factors?  Lab Results  Component Value Date   PSA 0.5 04/18/2020    5. Colon cancer screening: utd 6. Skin cancer screening- advised regular sunscreen use. Denies worrisome, changing, or new skin lesions.  7. Smoking associated screening (lung cancer screening, AAA screen 65-75, UA)- non  smoker- 0 ppd 8. STD screening - n/a  Wellness examination -     Lipid panel -     Comprehensive metabolic panel -     CBC with Differential/Platelet -     Hemoglobin A1c -     TSH -     PSA  Need for influenza vaccination -     Flu vaccine trivalent PF, 6mos and older(Flulaval,Afluria,Fluarix,Fluzone)  Screening for prostate cancer -     PSA  Prediabetes -     Hemoglobin A1c   Wellness-anticipatory guidance.  Work on Diet/Exercise  Check CBC,CMP,lipids,TSH, A1C.  F/u 1 yr   Recommended follow up: Return in about 1 year (around 08/08/2024) for annual physical.  Lab/Order associations:not fasting - coffee and power bar   I,Scott Stevenson,acting as a scribe for Angelena Sole, MD.,have documented all relevant documentation on the behalf of Angelena Sole, MD,as directed by  Angelena Sole, MD while in the presence of Angelena Sole, MD.  I, Angelena Sole, MD, have reviewed all documentation for this visit. The documentation on 08/09/23 for the exam, diagnosis, procedures, and orders are all accurate and complete.   Angelena Sole, MD

## 2023-11-10 ENCOUNTER — Ambulatory Visit: Payer: Self-pay | Admitting: Dermatology

## 2023-11-10 ENCOUNTER — Encounter: Payer: Self-pay | Admitting: Dermatology

## 2023-11-10 VITALS — BP 131/83 | HR 57

## 2023-11-10 DIAGNOSIS — L719 Rosacea, unspecified: Secondary | ICD-10-CM

## 2023-11-10 DIAGNOSIS — L814 Other melanin hyperpigmentation: Secondary | ICD-10-CM

## 2023-11-10 DIAGNOSIS — D1801 Hemangioma of skin and subcutaneous tissue: Secondary | ICD-10-CM

## 2023-11-10 DIAGNOSIS — L821 Other seborrheic keratosis: Secondary | ICD-10-CM | POA: Diagnosis not present

## 2023-11-10 DIAGNOSIS — D229 Melanocytic nevi, unspecified: Secondary | ICD-10-CM

## 2023-11-10 DIAGNOSIS — W908XXA Exposure to other nonionizing radiation, initial encounter: Secondary | ICD-10-CM

## 2023-11-10 DIAGNOSIS — Z1283 Encounter for screening for malignant neoplasm of skin: Secondary | ICD-10-CM

## 2023-11-10 DIAGNOSIS — L578 Other skin changes due to chronic exposure to nonionizing radiation: Secondary | ICD-10-CM

## 2023-11-10 MED ORDER — MINOCYCLINE HCL 100 MG PO CAPS: 100.0000 mg | ORAL_CAPSULE | Freq: Every day | ORAL | 3 refills | Status: DC

## 2023-11-10 NOTE — Progress Notes (Signed)
   Total Body Skin Exam (TBSE) Visit   Subjective  Scott Stevenson is a 63 y.o. male who presents for the following: Skin Cancer Screening and Full Body Skin Exam  Patient presents today for follow up visit for TBSE. Patient was last evaluated on 05/10/23 . Patient denies medication changes. Patient reports he  does not have spots, moles and lesions of concern to be evaluated. Patient reports throughout his lifetime he  has had minimal sun exposure. Currently, patient reports if he  has excessive sun exposure, he does not apply sunscreen and/or wears protective coverings. Patient reports he  does not have hx of bx. Patient denies  family history of skin cancers. The patient has spots, moles and lesions to be evaluated, some may be new or changing and the patient has concerns that these could be cancer.  The following portions of the chart were reviewed this encounter and updated as appropriate: medications, allergies, medical history  Review of Systems:  No other skin or systemic complaints except as noted in HPI or Assessment and Plan.  Objective  Well appearing patient in no apparent distress; mood and affect are within normal limits.  A full examination was performed including scalp, head, eyes, ears, nose, lips, neck, chest, axillae, abdomen, back, buttocks, bilateral upper extremities, bilateral lower extremities, hands, feet, fingers, toes, fingernails, and toenails. All findings within normal limits unless otherwise noted below.   Relevant physical exam findings are noted in the Assessment and Plan.    Assessment & Plan   LENTIGINES, SEBORRHEIC KERATOSES, HEMANGIOMAS - Benign normal skin lesions - Benign-appearing - Call for any changes  MELANOCYTIC NEVI - Tan-brown and/or pink-flesh-colored symmetric macules and papules - Benign appearing on exam today - Observation - Call clinic for new or changing moles - Recommend daily use of broad spectrum spf 30+ sunscreen to sun-exposed  areas.   MODERATE ACTINIC DAMAGE - Chronic condition, secondary to cumulative UV/sun exposure - diffuse scaly erythematous macules with underlying dyspigmentation - Recommend daily broad spectrum sunscreen SPF 30+ to sun-exposed areas, reapply every 2 hours as needed.  - Staying in the shade or wearing long sleeves, sun glasses (UVA+UVB protection) and wide brim hats (4-inch brim around the entire circumference of the hat) are also recommended for sun protection.  - Call for new or changing lesions.  SKIN CANCER SCREENING PERFORMED TODAY.    No follow-ups on file.    Documentation: I have reviewed the above documentation for accuracy and completeness, and I agree with the above.  I, Jetta Ager, am acting as scribe for Louana Roup, DO.  Louana Roup, DO

## 2023-11-10 NOTE — Patient Instructions (Signed)

## 2024-08-02 ENCOUNTER — Telehealth: Payer: Self-pay | Admitting: Family Medicine

## 2024-08-02 NOTE — Telephone Encounter (Signed)
 Copied from CRM #[id number]. Topic: General - Other >> Aug 02, 2024  4:09 PM Frederich PARAS wrote: Reason for CRM: schedule can't come are week of November 17th and week of December 15th. Other than that pt is available , however he have not got his schedule for January yet, please let pt know if something becomes available to reschedule his physical

## 2024-08-02 NOTE — Telephone Encounter (Signed)
 LVM for Patient to call back to reschedule CPE

## 2024-08-09 ENCOUNTER — Encounter: Payer: BC Managed Care – PPO | Admitting: Family Medicine

## 2024-11-09 ENCOUNTER — Ambulatory Visit: Payer: 59 | Admitting: Dermatology

## 2024-11-09 ENCOUNTER — Encounter: Payer: Self-pay | Admitting: Dermatology

## 2024-11-09 VITALS — BP 98/79

## 2024-11-09 DIAGNOSIS — L814 Other melanin hyperpigmentation: Secondary | ICD-10-CM

## 2024-11-09 DIAGNOSIS — Z1283 Encounter for screening for malignant neoplasm of skin: Secondary | ICD-10-CM

## 2024-11-09 DIAGNOSIS — D1801 Hemangioma of skin and subcutaneous tissue: Secondary | ICD-10-CM | POA: Diagnosis not present

## 2024-11-09 DIAGNOSIS — L578 Other skin changes due to chronic exposure to nonionizing radiation: Secondary | ICD-10-CM | POA: Diagnosis not present

## 2024-11-09 DIAGNOSIS — L821 Other seborrheic keratosis: Secondary | ICD-10-CM | POA: Diagnosis not present

## 2024-11-09 DIAGNOSIS — D229 Melanocytic nevi, unspecified: Secondary | ICD-10-CM

## 2024-11-09 DIAGNOSIS — W908XXA Exposure to other nonionizing radiation, initial encounter: Secondary | ICD-10-CM | POA: Diagnosis not present

## 2024-11-09 DIAGNOSIS — L719 Rosacea, unspecified: Secondary | ICD-10-CM

## 2024-11-09 MED ORDER — MINOCYCLINE HCL 100 MG PO CAPS: 100.0000 mg | ORAL_CAPSULE | Freq: Every day | ORAL | 3 refills | Status: AC

## 2024-11-09 NOTE — Patient Instructions (Signed)

## 2024-11-09 NOTE — Progress Notes (Signed)
" ° °  Total Body Skin Exam (TBSE) Visit   Subjective  Scott Stevenson is a 64 y.o. male ESTABLISHED PATIENT who presents for the following:  Total Body Skin Exam (TBSE)  Patient was last evaluated for TBSE on 11/10/23 .  Patient does not have spots of concern to be evaluated. He does apply sunscreen and/or wears protective coverings. Denied Hx of Bx. Denied family Hx of skin cancers.   The patient has spots, moles and lesions to be evaluated, some may be new or changing and the patient has concerns that these could be cancer.  The following portions of the chart were reviewed this encounter and updated as appropriate: medications, allergies, medical history  Review of Systems:  No other skin or systemic complaints except as noted in HPI or Assessment and Plan.  Objective  Well appearing patient in no apparent distress; mood and affect are within normal limits.  A full examination was performed including scalp, head, eyes, ears, nose, lips, neck, chest, axillae, abdomen, back, buttocks, bilateral upper extremities, bilateral lower extremities, hands, feet, fingers, toes, fingernails, and toenails. All findings within normal limits unless otherwise noted below.   Relevant physical exam findings are noted in the Assessment and Plan.    Assessment & Plan   LENTIGINES, SEBORRHEIC KERATOSES, HEMANGIOMAS - Benign normal skin lesions - Benign-appearing - Call for any changes  BENIGN MELANOCYTIC NEVI - Tan-brown and/or pink-flesh-colored symmetric macules and papules - Benign appearing on exam today - Observation - Call clinic for new or changing moles - Recommend daily use of broad spectrum spf 30+ sunscreen to sun-exposed areas.   MODERATE ACTINIC DAMAGE - Chronic condition, secondary to cumulative UV/sun exposure - diffuse scaly erythematous macules with underlying dyspigmentation - Recommend daily broad spectrum sunscreen SPF 30+ to sun-exposed areas, reapply every 2 hours as needed.   - Staying in the shade or wearing long sleeves, sun glasses (UVA+UVB protection) and wide brim hats (4-inch brim around the entire circumference of the hat) are also recommended for sun protection.  - Call for new or changing lesions.   SKIN CANCER SCREENING PERFORMED TODAY.  No follow-ups on file.   Documentation: I have reviewed the above documentation for accuracy and completeness, and I agree with the above.  I, Jatara Huettner Maranda, CMA II, am acting as scribe for:  Delon Lenis, DO "
